# Patient Record
Sex: Male | Born: 1965
Health system: Southern US, Community
[De-identification: ages and names within clinical notes are randomized; demographics above are authoritative.]

## PROBLEM LIST (undated history)

## (undated) DIAGNOSIS — M109 Gout, unspecified: Secondary | ICD-10-CM

## (undated) DIAGNOSIS — I10 Essential (primary) hypertension: Secondary | ICD-10-CM

## (undated) DIAGNOSIS — M25511 Pain in right shoulder: Secondary | ICD-10-CM

## (undated) DIAGNOSIS — E786 Lipoprotein deficiency: Secondary | ICD-10-CM

## (undated) DIAGNOSIS — S39012A Strain of muscle, fascia and tendon of lower back, initial encounter: Secondary | ICD-10-CM

## (undated) DIAGNOSIS — J302 Other seasonal allergic rhinitis: Secondary | ICD-10-CM

## (undated) DIAGNOSIS — E669 Obesity, unspecified: Secondary | ICD-10-CM

## (undated) DIAGNOSIS — E7800 Pure hypercholesterolemia, unspecified: Secondary | ICD-10-CM

## (undated) DIAGNOSIS — N200 Calculus of kidney: Secondary | ICD-10-CM

## (undated) HISTORY — PX: WISDOM TOOTH EXTRACTION: SHX21

## (undated) HISTORY — DX: Strain of muscle, fascia and tendon of lower back, initial encounter: S39.012A

## (undated) HISTORY — DX: Obesity, unspecified: E66.9

## (undated) HISTORY — DX: Pain in right shoulder: M25.511

## (undated) HISTORY — DX: Other seasonal allergic rhinitis: J30.2

## (undated) HISTORY — DX: Gout, unspecified: M10.9

## (undated) HISTORY — PX: CHOLECYSTECTOMY: SHX55

## (undated) HISTORY — DX: Pure hypercholesterolemia, unspecified: E78.00

## (undated) HISTORY — DX: Lipoprotein deficiency: E78.6

## (undated) HISTORY — DX: Calculus of kidney: N20.0

## (undated) HISTORY — DX: Essential (primary) hypertension: I10

---

## 2006-12-13 ENCOUNTER — Ambulatory Visit: Payer: Self-pay | Admitting: General Practice

## 2006-12-17 ENCOUNTER — Other Ambulatory Visit: Payer: Self-pay

## 2006-12-18 ENCOUNTER — Ambulatory Visit: Payer: Self-pay | Admitting: Surgery

## 2014-12-24 ENCOUNTER — Emergency Department: Payer: Self-pay | Admitting: Emergency Medicine

## 2014-12-24 LAB — BASIC METABOLIC PANEL
ANION GAP: 6 — AB (ref 7–16)
BUN: 14 mg/dL (ref 7–18)
CREATININE: 1.15 mg/dL (ref 0.60–1.30)
Calcium, Total: 8.9 mg/dL (ref 8.5–10.1)
Chloride: 105 mmol/L (ref 98–107)
Co2: 30 mmol/L (ref 21–32)
GLUCOSE: 94 mg/dL (ref 65–99)
Osmolality: 281 (ref 275–301)
Potassium: 3.8 mmol/L (ref 3.5–5.1)
SODIUM: 141 mmol/L (ref 136–145)

## 2014-12-24 LAB — CBC
HCT: 46.2 % (ref 40.0–52.0)
HGB: 15.6 g/dL (ref 13.0–18.0)
MCH: 30.4 pg (ref 26.0–34.0)
MCHC: 33.8 g/dL (ref 32.0–36.0)
MCV: 90 fL (ref 80–100)
PLATELETS: 172 10*3/uL (ref 150–440)
RBC: 5.14 10*6/uL (ref 4.40–5.90)
RDW: 14 % (ref 11.5–14.5)
WBC: 7 10*3/uL (ref 3.8–10.6)

## 2014-12-24 LAB — TROPONIN I

## 2016-03-08 ENCOUNTER — Ambulatory Visit
Admission: RE | Admit: 2016-03-08 | Discharge: 2016-03-08 | Disposition: A | Discharge status: Home / Self Care | Payer: Worker's Compensation | Source: Ambulatory Visit | Attending: Family Medicine | Admitting: Family Medicine

## 2016-03-08 ENCOUNTER — Other Ambulatory Visit: Payer: Self-pay | Admitting: Family Medicine

## 2016-03-08 DIAGNOSIS — M25562 Pain in left knee: Secondary | ICD-10-CM | POA: Diagnosis not present

## 2016-03-08 DIAGNOSIS — M1712 Unilateral primary osteoarthritis, left knee: Secondary | ICD-10-CM | POA: Diagnosis not present

## 2016-03-08 DIAGNOSIS — R52 Pain, unspecified: Secondary | ICD-10-CM

## 2016-03-08 DIAGNOSIS — M25462 Effusion, left knee: Secondary | ICD-10-CM | POA: Insufficient documentation

## 2016-03-24 ENCOUNTER — Other Ambulatory Visit: Payer: Self-pay | Admitting: Family Medicine

## 2016-03-24 DIAGNOSIS — M25562 Pain in left knee: Secondary | ICD-10-CM

## 2016-04-13 ENCOUNTER — Ambulatory Visit
Admission: RE | Admit: 2016-04-13 | Discharge: 2016-04-13 | Disposition: A | Discharge status: Home / Self Care | Payer: Worker's Compensation | Source: Ambulatory Visit | Attending: Family Medicine | Admitting: Family Medicine

## 2016-04-13 DIAGNOSIS — M1712 Unilateral primary osteoarthritis, left knee: Secondary | ICD-10-CM | POA: Diagnosis not present

## 2016-04-13 DIAGNOSIS — M25562 Pain in left knee: Secondary | ICD-10-CM

## 2016-04-13 DIAGNOSIS — M7042 Prepatellar bursitis, left knee: Secondary | ICD-10-CM | POA: Insufficient documentation

## 2016-04-13 DIAGNOSIS — M25462 Effusion, left knee: Secondary | ICD-10-CM | POA: Insufficient documentation

## 2016-04-13 DIAGNOSIS — M660 Rupture of popliteal cyst: Secondary | ICD-10-CM | POA: Diagnosis not present

## 2016-08-17 IMAGING — MR MR KNEE*L* W/O CM
6 series · 40 of 40 positions shown · non-contrast
Comparison: 03/08/2016

CLINICAL DATA: Stepping injury in early February 2016, continued pain
in left knee especially laterally, instability.

EXAM:
MRI OF THE LEFT KNEE WITHOUT CONTRAST
TECHNIQUE: Multiplanar, multisequence MR imaging of the knee was performed. No
intravenous contrast was administered.

[Series 3: PD fat-sat · axial · 3.0mm · 0.62mm/px · z∈[-48,+80]mm · 9 of 40 slices shown (1 of 4)]
[im 1/40]
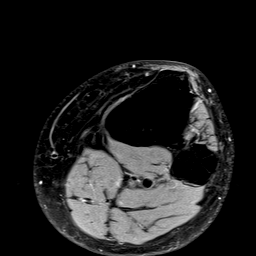
[im 5/40]
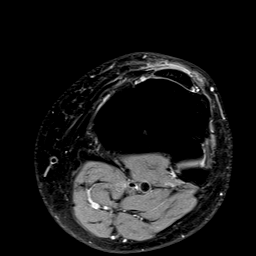
[im 10/40]
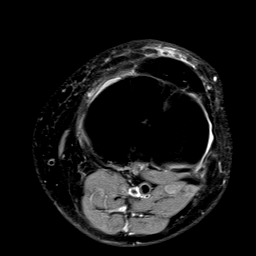
[im 15/40]
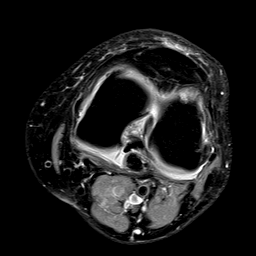
[im 20/40]
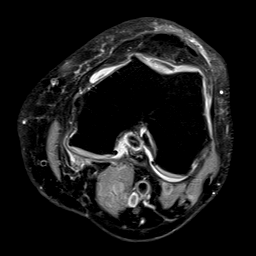
[im 25/40]
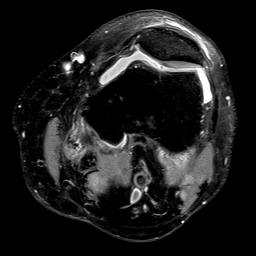
[im 30/40]
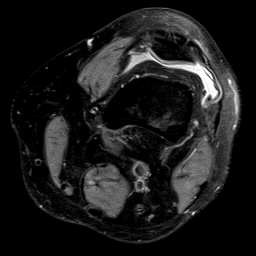
[im 35/40]
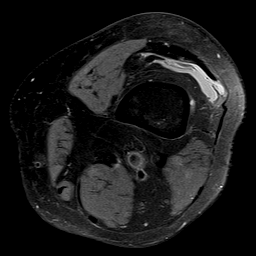
[im 40/40]
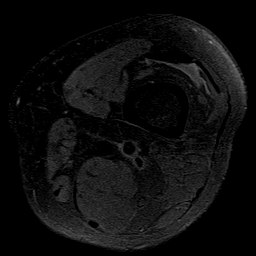

[Series 4: T1 · coronal · 3.0mm · 0.50mm/px · 7 of 35 slices shown]
[im 1/35]
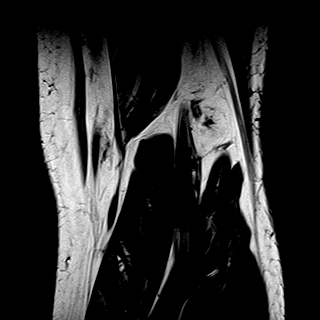
[im 6/35]
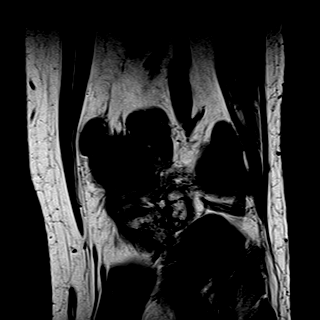
[im 12/35]
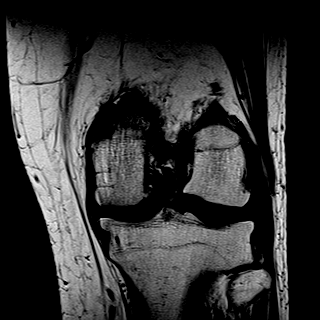
[im 18/35]
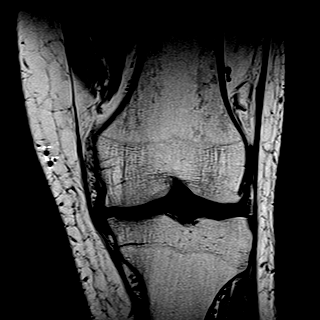
[im 23/35]
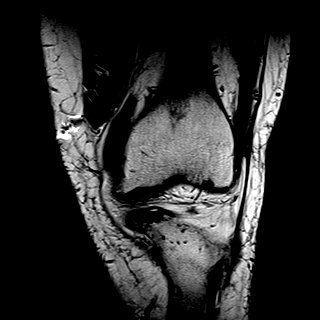
[im 29/35]
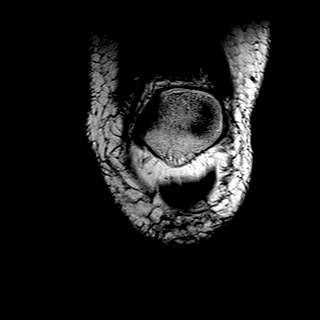
[im 35/35]
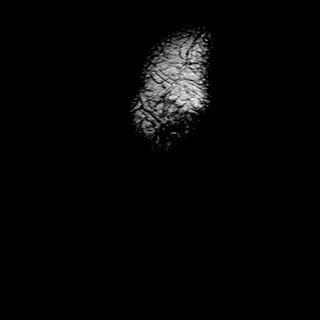

[Series 5: T2 fat-sat · coronal · 3.0mm · 0.31mm/px · 7 of 35 slices shown]
[im 1/35]
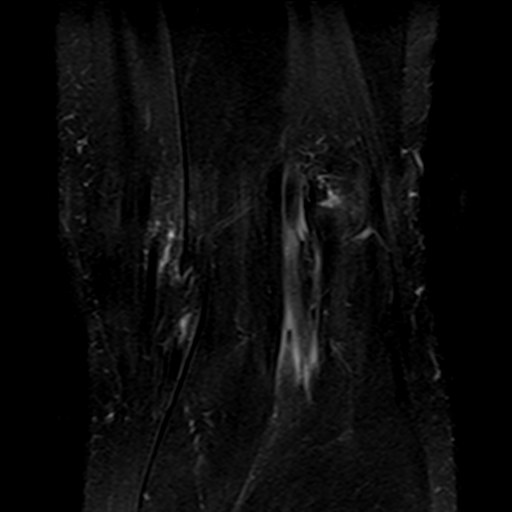
[im 6/35]
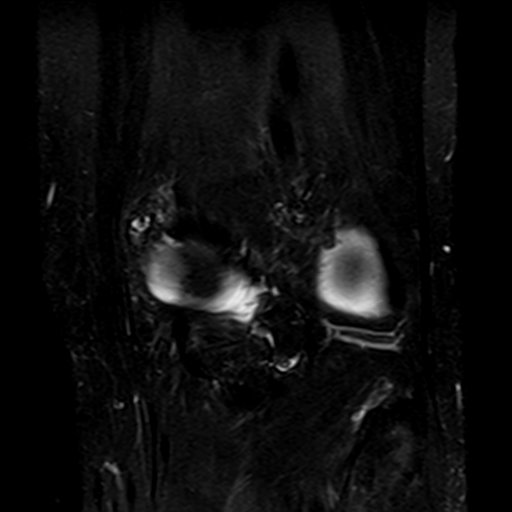
[im 12/35]
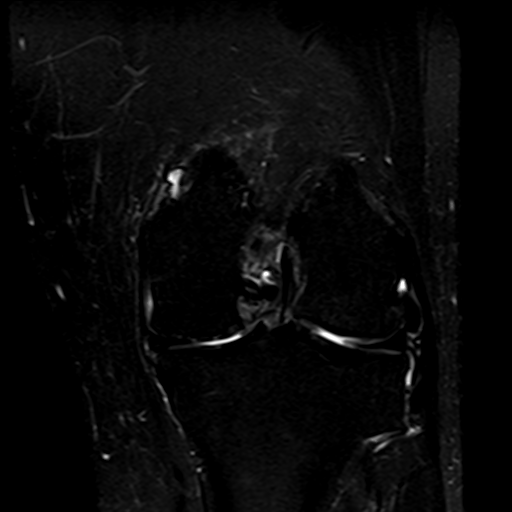
[im 18/35]
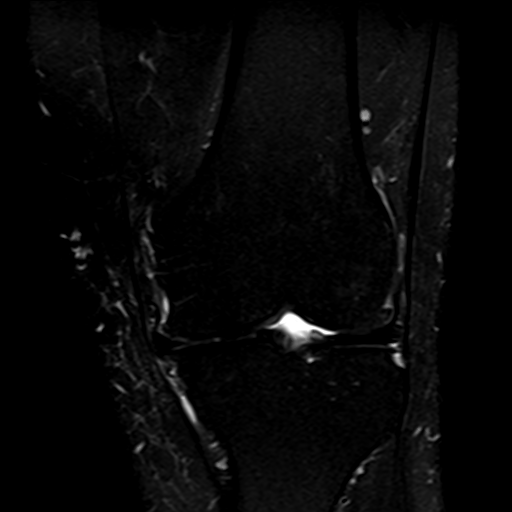
[im 23/35]
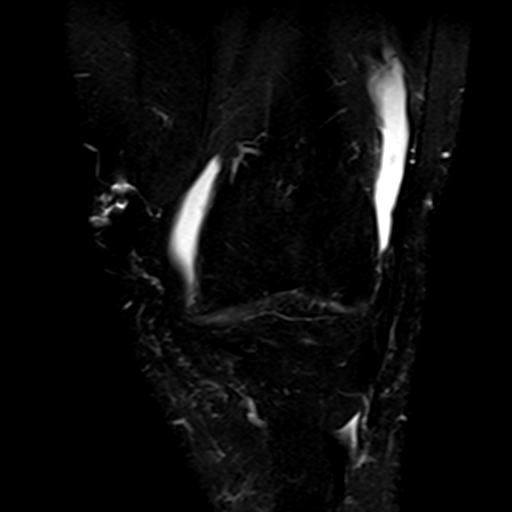
[im 29/35]
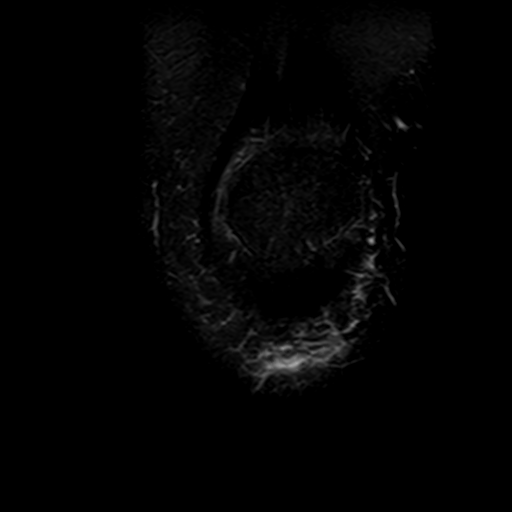
[im 35/35]
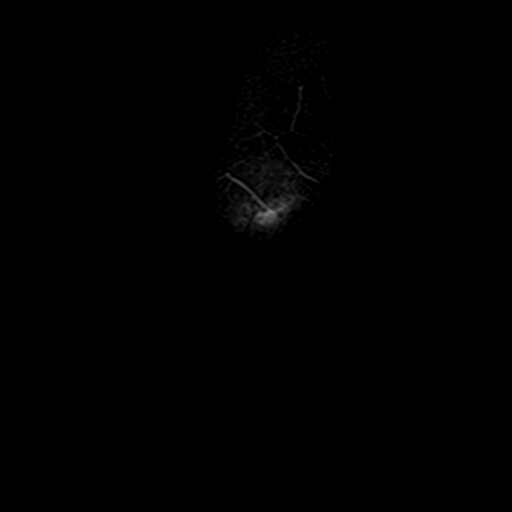

[Series 6: PD fat-sat · coronal · 3.0mm · 0.62mm/px · 7 of 35 slices shown (2 of 4)]
[im 1/35]
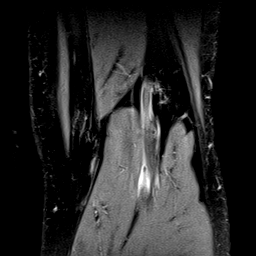
[im 6/35]
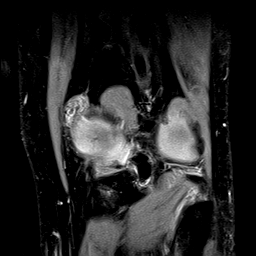
[im 12/35]
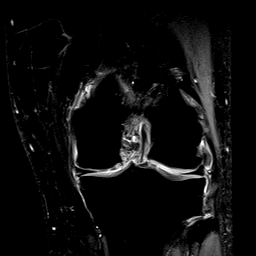
[im 18/35]
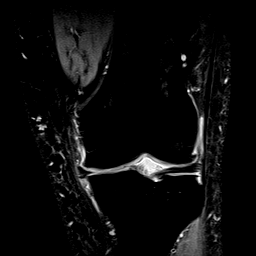
[im 23/35]
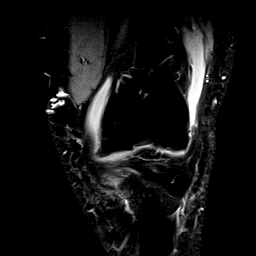
[im 29/35]
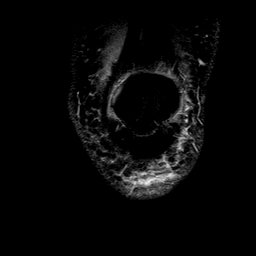
[im 35/35]
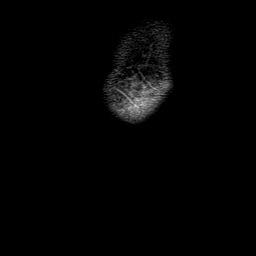

[Series 7: PD fat-sat · sagittal · 3.0mm · 0.62mm/px · 7 of 35 slices shown (3 of 4)]
[im 1/35]
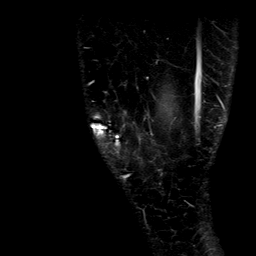
[im 6/35]
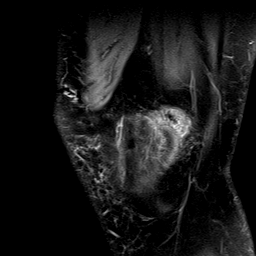
[im 12/35]
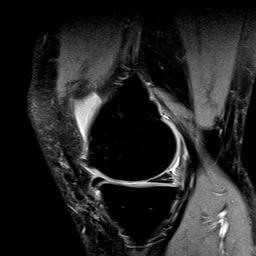
[im 18/35]
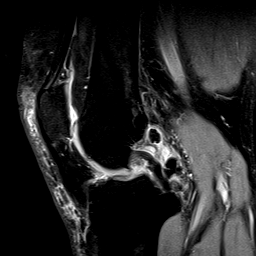
[im 23/35]
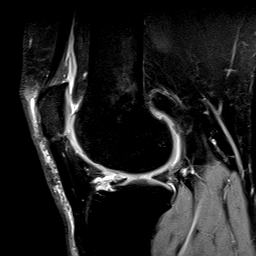
[im 29/35]
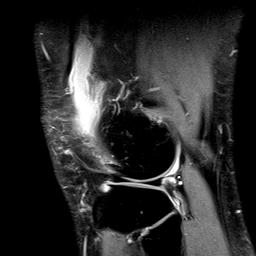
[im 35/35]
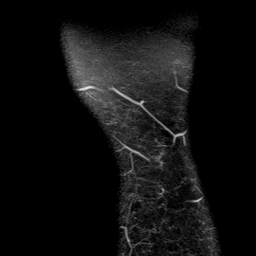

[Series 8: PD fat-sat · oblique · 2.0mm · 0.62mm/px · 3 of 13 slices shown (4 of 4)]
[im 1/13]
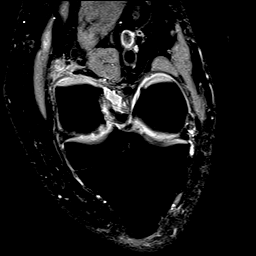
[im 7/13]
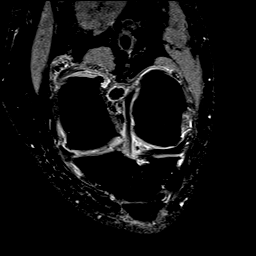
[im 13/13]
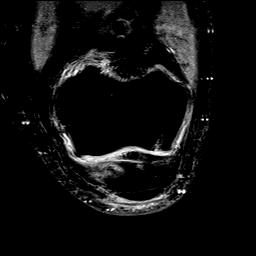

[40 of 40 positions shown; findings below may reference images not displayed]

FINDINGS: MENISCI

Medial meniscus: Grade 2 signal in the posterior horn and midbody
medial meniscus without surface extension.

Lateral meniscus:  Unremarkable

LIGAMENTS

Cruciates:  Unremarkable

Collaterals: Mild edema tracks adjacent to the MCL. This can be
incidental but in the appropriate clinical circumstance could
represent grade 1 sprain.

CARTILAGE

Patellofemoral: Prominent thinning of articular cartilage inferiorly
along the medial patellar facet. Moderate to prominent chondral
thinning in the femoral trochlear groove with some focal
full-thickness cartilage loss and focal degenerative subcortical
bandlike edema inferiorly in the lateral femoral trochlear groove.
Articular spurring inferiorly in the medial femoral trochlear
groove. Patellar marginal spurring.

Medial: Severe degenerative chondral thinning. Heterogeneity of the
remaining medial femoral condylar cartilage. Marginal spurring.

Lateral: Focal chondral full-thickness defect about 1.5 cm in
diameter centrally along the lateral femoral condyle. Marginal
spurring.

Joint:  Small to moderate knee effusion.

There numerous free osteochondral fragments loose within the knee
joint. Posteriorly, the largest of these measures 2.3 by 0.9 by
cm as on image [DATE]. Adjacent fragment measures 5 mm. Posterior to
the lateral meniscus there is a 4 mm potential fragment and along
the upper margin of the Baker' s cyst there is a 9 mm irregular
fragment. Superiorly and posteriorly in the intercondylar notch
there is a 1 cm fragment as shown up on image [DATE]. 3 mm loose
fragment adjacent to the root of the anterior horn lateral meniscus
on image [DATE].

Popliteal Fossa: Small probably ruptured Baker's cyst containing
free osteochondral fragment superiorly.

Extensor Mechanism: Prepatellar subcutaneous edema and potential
mild prepatellar bursitis.

Bones: No significant extra-articular osseous abnormalities
identified.
IMPRESSION: 1. Numerous free osteochondral fragments are loose within the joint,
and also in the small ruptured Baker's cyst.
2. Severe osteoarthritis with some focal full-thickness chondral
loss in the lateral compartment and severe chondral thinning in the
medial compartment.
3. Small to moderate knee effusion.
4. Mild edema tracks adjacent to the MCL. This can be incidental but
in the appropriate clinical circumstance could represent grade 1
sprain.
5. Prepatellar subcutaneous edema and potential mild prepatellar
bursitis.

## 2019-06-07 DIAGNOSIS — Z20828 Contact with and (suspected) exposure to other viral communicable diseases: Secondary | ICD-10-CM | POA: Diagnosis not present

## 2019-08-21 ENCOUNTER — Ambulatory Visit: Payer: Self-pay

## 2019-08-21 DIAGNOSIS — Z23 Encounter for immunization: Secondary | ICD-10-CM

## 2019-10-28 ENCOUNTER — Encounter: Payer: Self-pay | Admitting: Occupational Medicine

## 2019-10-28 ENCOUNTER — Other Ambulatory Visit: Payer: Self-pay

## 2019-10-28 ENCOUNTER — Ambulatory Visit: Payer: Self-pay | Admitting: Occupational Medicine

## 2019-10-28 DIAGNOSIS — M545 Low back pain, unspecified: Secondary | ICD-10-CM

## 2019-10-28 DIAGNOSIS — M549 Dorsalgia, unspecified: Secondary | ICD-10-CM | POA: Insufficient documentation

## 2019-10-28 MED ORDER — CYCLOBENZAPRINE HCL 10 MG PO TABS: 10.0000 mg | ORAL_TABLET | Freq: Every day | ORAL | 0 refills | Status: DC

## 2019-10-28 NOTE — Progress Notes (Signed)
   Subjective:    Patient ID: Jimmy Mccall, male    DOB: 17-Oct-1966, 53 y.o.   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  He presents with a 2-day history of left SI joint pain.  There is no workplace injury or accident.  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  Sunday morning he woke up with left SI joint pain.  Denies any leg numbness weakness or pain.  No bowel or bladder changes.  He had a similar episode of low back pain back in 2012.  This was relieved with time and cyclobenzaprine.  He tells me this episode heat is quite helpful.    Review of Systems Negative for leg numbness weakness pain.  No bowel bladder changes.  No history of trauma.    Objective:   Physical Exam Vitals:   10/28/19 0814  BP: 140/90  Pulse: 72  Resp: 16  Temp: 99.1 F (37.3 C)  SpO2: 97%   Appears in minor distress.  Transitions are slow and careful.  Lumbar range of motion is limited.  He can touch his knees.  Tenderness in the left SI joint region.  Normal strength sensory and reflexes in both lower extremities.  Positive Faber on the left.  No flank pain.      Assessment & Plan:  I think he is having some SI joint inflammation and recommend continued use of aleve over-the-counter up to 2 tablets 2 times a day.  I will authorize prescription for Flexeril 10 mg1/2 to 1 tab  at bedtime #15 no refills.  Prescription sent electronically.  I also showed him some online video exercises for SI joint dysfunction and instructed him to do these daily. 2 boxes of thermacare given from clinic.   He will be on alternate duty until Monday, December 14.  He may resume full duty Monday, December 14.  No plan follow-up in this clinic, only if problems.

## 2019-10-28 NOTE — Progress Notes (Signed)
Woke up Sunday morning & back was tight.  Feels good when standing up.  When sits down it tightens back up.  States when took hot showers yesterday & this morning felt some relief.  Taking Advil 200 mg (2 tabs) every 4-5 hours with minimal relief.Had an old bottle of Percocet prescribed by Dr. Corinda Gubler & said he's taken a couple of them & they are not helping.  Can't bend over to tie shoes.  Hurt like this 8 years ago when Dr. Burt Ek tx'd him for Lumbar strain & tx'd with aleve bid & cyclobenzeprene 5 mg tid back on 07/28/2011.  AMD

## 2019-12-02 ENCOUNTER — Other Ambulatory Visit: Payer: Self-pay

## 2019-12-02 ENCOUNTER — Ambulatory Visit: Payer: Self-pay | Admitting: Occupational Medicine

## 2019-12-02 ENCOUNTER — Encounter: Payer: Self-pay | Admitting: Occupational Medicine

## 2019-12-02 VITALS — BP 150/86 | HR 96 | Temp 98.7°F | Resp 20 | Ht 70.0 in | Wt 252.0 lb

## 2019-12-02 DIAGNOSIS — Z0000 Encounter for general adult medical examination without abnormal findings: Secondary | ICD-10-CM

## 2019-12-02 LAB — POCT URINALYSIS DIPSTICK
Bilirubin, UA: NEGATIVE
Blood, UA: NEGATIVE
Glucose, UA: NEGATIVE
Ketones, UA: NEGATIVE
Leukocytes, UA: NEGATIVE
Nitrite, UA: NEGATIVE
Protein, UA: NEGATIVE
Spec Grav, UA: 1.025 (ref 1.010–1.025)
Urobilinogen, UA: 0.2 E.U./dL
pH, UA: 6 (ref 5.0–8.0)

## 2019-12-02 NOTE — Progress Notes (Signed)
DOT exam performed at onsite employment clinic.  Given 1 year medical card.  Paper records stored onsite.

## 2019-12-15 ENCOUNTER — Ambulatory Visit: Payer: Self-pay

## 2019-12-15 ENCOUNTER — Other Ambulatory Visit: Payer: Self-pay

## 2019-12-15 DIAGNOSIS — Z0000 Encounter for general adult medical examination without abnormal findings: Secondary | ICD-10-CM

## 2019-12-15 LAB — POCT URINALYSIS DIPSTICK
Bilirubin, UA: NEGATIVE
Blood, UA: NEGATIVE
Glucose, UA: NEGATIVE
Ketones, UA: NEGATIVE
Leukocytes, UA: NEGATIVE
Nitrite, UA: NEGATIVE
Protein, UA: NEGATIVE
Spec Grav, UA: 1.025 (ref 1.010–1.025)
Urobilinogen, UA: 0.2 E.U./dL
pH, UA: 6 (ref 5.0–8.0)

## 2019-12-15 NOTE — Progress Notes (Signed)
Scheduled to complete physical 12/22/2019 with Darlin Priestly, PA-C (Interim Provider).  AMD

## 2019-12-16 LAB — CMP12+LP+TP+TSH+6AC+PSA+CBC…
ALT: 34 IU/L (ref 0–44)
AST: 20 IU/L (ref 0–40)
Albumin/Globulin Ratio: 1.8 (ref 1.2–2.2)
Alkaline Phosphatase: 124 IU/L — ABNORMAL HIGH (ref 39–117)
BUN/Creatinine Ratio: 13 (ref 9–20)
BUN: 15 mg/dL (ref 6–24)
Basophils Absolute: 0 10*3/uL (ref 0.0–0.2)
Chloride: 101 mmol/L (ref 96–106)
Chol/HDL Ratio: 4.4 ratio (ref 0.0–5.0)
Eos: 2 %
Estimated CHD Risk: 0.9 times avg. (ref 0.0–1.0)
GFR calc non Af Amer: 71 mL/min/{1.73_m2} (ref >59)
GGT: 37 IU/L (ref 0–65)
Globulin, Total: 2.4 g/dL (ref 1.5–4.5)
Hematocrit: 50.1 % (ref 37.5–51.0)
Hemoglobin: 17.2 g/dL (ref 13.0–17.7)
LDH: 197 IU/L (ref 121–224)
LDL Chol Calc (NIH): 117 mg/dL — ABNORMAL HIGH (ref 0–99)
MCHC: 34.3 g/dL (ref 31.5–35.7)
Monocytes Absolute: 0.6 10*3/uL (ref 0.1–0.9)
Monocytes: 8 %
Neutrophils Absolute: 5.7 10*3/uL (ref 1.4–7.0)
Platelets: 198 10*3/uL (ref 150–450)
Potassium: 3.9 mmol/L (ref 3.5–5.2)
RDW: 13.4 % (ref 11.6–15.4)
T3 Uptake Ratio: 22 % — ABNORMAL LOW (ref 24–39)
Triglycerides: 111 mg/dL (ref 0–149)
Uric Acid: 4.5 mg/dL (ref 3.8–8.4)

## 2019-12-16 LAB — CMP12+LP+TP+TSH+6AC+PSA+CBC?
Albumin: 4.4 g/dL (ref 3.8–4.9)
Basos: 1 %
Bilirubin Total: 1.5 mg/dL — ABNORMAL HIGH (ref 0.0–1.2)
Calcium: 9.1 mg/dL (ref 8.7–10.2)
Cholesterol, Total: 177 mg/dL (ref 100–199)
Creatinine, Ser: 1.16 mg/dL (ref 0.76–1.27)
EOS (ABSOLUTE): 0.1 10*3/uL (ref 0.0–0.4)
Free Thyroxine Index: 1.5 (ref 1.2–4.9)
GFR calc Af Amer: 83 mL/min/{1.73_m2} (ref >59)
Glucose: 104 mg/dL — ABNORMAL HIGH (ref 65–99)
HDL: 40 mg/dL (ref >39)
Immature Grans (Abs): 0.1 10*3/uL (ref 0.0–0.1)
Immature Granulocytes: 1 %
Iron: 135 ug/dL (ref 38–169)
Lymphocytes Absolute: 1.5 10*3/uL (ref 0.7–3.1)
Lymphs: 19 %
MCH: 31.1 pg (ref 26.6–33.0)
MCV: 91 fL (ref 79–97)
Neutrophils: 69 %
Phosphorus: 3.2 mg/dL (ref 2.8–4.1)
Prostate Specific Ag, Serum: 0.9 ng/mL (ref 0.0–4.0)
RBC: 5.53 x10E6/uL (ref 4.14–5.80)
Sodium: 141 mmol/L (ref 134–144)
T4, Total: 6.7 ug/dL (ref 4.5–12.0)
TSH: 2.98 u[IU]/mL (ref 0.450–4.500)
Total Protein: 6.8 g/dL (ref 6.0–8.5)
VLDL Cholesterol Cal: 20 mg/dL (ref 5–40)
WBC: 8.1 10*3/uL (ref 3.4–10.8)

## 2019-12-22 ENCOUNTER — Other Ambulatory Visit: Payer: Self-pay

## 2019-12-22 ENCOUNTER — Ambulatory Visit: Payer: Self-pay | Admitting: Physician Assistant

## 2019-12-22 VITALS — BP 150/98 | HR 77 | Temp 99.0°F | Ht 70.0 in | Wt 256.8 lb

## 2019-12-22 DIAGNOSIS — Z0000 Encounter for general adult medical examination without abnormal findings: Secondary | ICD-10-CM

## 2019-12-22 MED ORDER — ALLOPURINOL 300 MG PO TABS: 300.0000 mg | ORAL_TABLET | Freq: Every day | ORAL | 3 refills | Status: DC

## 2019-12-22 MED ORDER — HYDROCHLOROTHIAZIDE 25 MG PO TABS: 25.0000 mg | ORAL_TABLET | Freq: Every day | ORAL | 3 refills | Status: DC

## 2019-12-22 NOTE — Progress Notes (Signed)
   Subjective: Annual physical    Patient ID: Jimmy Mccall, male    DOB: 1966/08/18, 54 y.o.   MRN: [id number]  HPI Patient presents with annual physical.  First concern of bilateral knee/ankle pain.   Review of Systems Hypertension and gout    Objective:   Physical Exam Patient appears no acute distress.  Obesity.  HEENT is unremarkable.  Neck without adenopathy or bruits.  Lungs clear to auscultation.  Heart regular rate and rhythm.  Abdomen mildly obese.  Normoactive bowel sounds.  Soft nontender palpation.  No obvious deformity to cervical lumbar spine.  Patient has full equal range of motion of the cervical lumbar spine.  No obvious deformity of the upper or lower extremities.  Patient had full neck range of motion of the upper and lower extremities.  Cranial nerves II through XII are grossly intact.       Assessment & Plan: Well exam.  Patient advised continue previous medications and follow-up as needed.  Prescription for allopurinol 100 hydrochlorothiazide was renewed.

## 2020-02-11 ENCOUNTER — Other Ambulatory Visit: Payer: Self-pay

## 2020-02-11 ENCOUNTER — Other Ambulatory Visit: Payer: Self-pay | Admitting: Physician Assistant

## 2020-02-11 DIAGNOSIS — M109 Gout, unspecified: Secondary | ICD-10-CM

## 2020-02-11 MED ORDER — ALLOPURINOL 100 MG PO TABS: 100.0000 mg | ORAL_TABLET | Freq: Every day | ORAL | 3 refills | Status: DC

## 2020-05-10 ENCOUNTER — Other Ambulatory Visit: Payer: Self-pay

## 2020-05-10 NOTE — Telephone Encounter (Signed)
Received a Rx request from [facility] for HCTZ 25 mg tablets.  Epic has that it was refilled 12/23/19 by Randel Pigg, PA-C for 90 tablets with 3 refills.  Sales promotion account executive & the pharmacist I spoke will said they do have the above Rx in their system.  Said to disregard the refill request dated 05/08/20.  They will get Jimmy Mccall's Rx ready for him.  AMD

## 2020-10-06 ENCOUNTER — Ambulatory Visit: Payer: Self-pay

## 2020-10-06 DIAGNOSIS — Z23 Encounter for immunization: Secondary | ICD-10-CM

## 2020-11-24 ENCOUNTER — Other Ambulatory Visit: Payer: Self-pay

## 2020-11-24 ENCOUNTER — Ambulatory Visit: Payer: Self-pay

## 2020-11-24 DIAGNOSIS — Z0000 Encounter for general adult medical examination without abnormal findings: Secondary | ICD-10-CM

## 2020-11-24 LAB — POCT URINALYSIS DIPSTICK
Bilirubin, UA: NEGATIVE
Blood, UA: NEGATIVE
Glucose, UA: NEGATIVE
Ketones, UA: NEGATIVE
Leukocytes, UA: NEGATIVE
Nitrite, UA: NEGATIVE
Protein, UA: NEGATIVE
Spec Grav, UA: 1.025 (ref 1.010–1.025)
Urobilinogen, UA: 0.2 E.U./dL
pH, UA: 6 (ref 5.0–8.0)

## 2020-11-24 NOTE — Progress Notes (Unsigned)
Scheduled to complete physical 12/02/19 with Dr. Pricilla Handler (Physician's name not yet associated to clinic in Epic).  AMD

## 2020-11-25 LAB — CMP12+LP+TP+TSH+6AC+PSA+CBC…
ALT: 40 IU/L (ref 0–44)
AST: 25 IU/L (ref 0–40)
Albumin/Globulin Ratio: 2 (ref 1.2–2.2)
Albumin: 4.7 g/dL (ref 3.8–4.9)
Alkaline Phosphatase: 135 IU/L — ABNORMAL HIGH (ref 44–121)
BUN/Creatinine Ratio: 12 (ref 9–20)
BUN: 14 mg/dL (ref 6–24)
Basophils Absolute: 0.1 10*3/uL (ref 0.0–0.2)
Basos: 1 %
Bilirubin Total: 1.4 mg/dL — ABNORMAL HIGH (ref 0.0–1.2)
Calcium: 9.6 mg/dL (ref 8.7–10.2)
Chloride: 98 mmol/L (ref 96–106)
Chol/HDL Ratio: 5.1 ratio — ABNORMAL HIGH (ref 0.0–5.0)
Cholesterol, Total: 199 mg/dL (ref 100–199)
Creatinine, Ser: 1.13 mg/dL (ref 0.76–1.27)
EOS (ABSOLUTE): 0.3 10*3/uL (ref 0.0–0.4)
Eos: 3 %
Estimated CHD Risk: 1.1 times avg. — ABNORMAL HIGH (ref 0.0–1.0)
Free Thyroxine Index: 1.8 (ref 1.2–4.9)
GFR calc Af Amer: 85 mL/min/{1.73_m2} (ref >59)
GFR calc non Af Amer: 73 mL/min/{1.73_m2} (ref >59)
GGT: 49 IU/L (ref 0–65)
Globulin, Total: 2.4 g/dL (ref 1.5–4.5)
Glucose: 93 mg/dL (ref 65–99)
HDL: 39 mg/dL — ABNORMAL LOW (ref >39)
Hematocrit: 50.4 % (ref 37.5–51.0)
Hemoglobin: 17.1 g/dL (ref 13.0–17.7)
Immature Grans (Abs): 0.1 10*3/uL (ref 0.0–0.1)
Immature Granulocytes: 1 %
Iron: 99 ug/dL (ref 38–169)
LDH: 167 IU/L (ref 121–224)
LDL Chol Calc (NIH): 137 mg/dL — ABNORMAL HIGH (ref 0–99)
Lymphocytes Absolute: 1.7 10*3/uL (ref 0.7–3.1)
Lymphs: 18 %
MCH: 30.7 pg (ref 26.6–33.0)
MCHC: 33.9 g/dL (ref 31.5–35.7)
MCV: 91 fL (ref 79–97)
Monocytes Absolute: 0.7 10*3/uL (ref 0.1–0.9)
Monocytes: 8 %
Neutrophils Absolute: 6.5 10*3/uL (ref 1.4–7.0)
Neutrophils: 69 %
Phosphorus: 3.5 mg/dL (ref 2.8–4.1)
Platelets: 219 10*3/uL (ref 150–450)
Potassium: 3.9 mmol/L (ref 3.5–5.2)
Prostate Specific Ag, Serum: 0.7 ng/mL (ref 0.0–4.0)
RBC: 5.57 x10E6/uL (ref 4.14–5.80)
RDW: 13.2 % (ref 11.6–15.4)
Sodium: 139 mmol/L (ref 134–144)
T3 Uptake Ratio: 24 % (ref 24–39)
T4, Total: 7.7 ug/dL (ref 4.5–12.0)
TSH: 2.75 u[IU]/mL (ref 0.450–4.500)
Total Protein: 7.1 g/dL (ref 6.0–8.5)
Triglycerides: 126 mg/dL (ref 0–149)
Uric Acid: 4.9 mg/dL (ref 3.8–8.4)
VLDL Cholesterol Cal: 23 mg/dL (ref 5–40)
WBC: 9.3 10*3/uL (ref 3.4–10.8)

## 2020-12-01 ENCOUNTER — Encounter: Payer: Self-pay | Admitting: Adult Medicine

## 2020-12-01 ENCOUNTER — Ambulatory Visit: Payer: Self-pay | Admitting: Adult Medicine

## 2020-12-01 ENCOUNTER — Other Ambulatory Visit: Payer: Self-pay

## 2020-12-01 VITALS — BP 150/100 | HR 69 | Temp 98.7°F | Resp 16 | Ht 70.0 in | Wt 240.0 lb

## 2020-12-01 DIAGNOSIS — I10 Essential (primary) hypertension: Secondary | ICD-10-CM | POA: Insufficient documentation

## 2020-12-01 DIAGNOSIS — Z0000 Encounter for general adult medical examination without abnormal findings: Secondary | ICD-10-CM | POA: Insufficient documentation

## 2020-12-01 DIAGNOSIS — M1000 Idiopathic gout, unspecified site: Secondary | ICD-10-CM | POA: Insufficient documentation

## 2020-12-01 MED ORDER — TRIAMTERENE-HCTZ 75-50 MG PO TABS: 1.0000 | ORAL_TABLET | Freq: Every day | ORAL | Status: DC

## 2020-12-01 NOTE — Progress Notes (Signed)
Subjective:    Patient ID: Jimmy Mccall, male    DOB: [birth year], 55 y.o.   MRN: [id number]  HPI  51 his gout well controlled last flare 3y ago presents with no complaints except minor stiffness in L knee from old injury which does not impede function.    On meat restriction diet Elevated BP rx with Hctz  MEDs  300am 100pm Allopurinol    Hctz 25 qd  FH maternal side HTN, brother dec age51 htn, aneuysm SH non smoker  Review of Systems Speaks about hx of difficulty with wt Other wise noncontributory    Objective:   Physical Exam >30 IBW otherwise NAD A&Ox3 normal mood and affect  O2 sat 98 p74 HEENT [place]/AT [place] intact Pulm pickwickian chest, lungsclear to A&P Cardiac PMI 1' lat midline no murmer ectopy unchanged by manuvers Abd protuberant girth 45.5 inches bs present, no organ enlargement appreciated  Extremities  C C E neg Neuro intact sensory/motor, nl 2 pt discrimination, nl proprioception no abn reflexes Glucose 65 - 99 mg/dL 93  104High  94    Uric Acid 3.8 - 8.4 mg/dL 4.9  4.5 CM     Comment:      Therapeutic target for gout patients: <6.0  BUN 6 - 24 mg/dL _0 R    Creatinine, Ser 0.76 - 1.27 mg/dL 1.13  1.16  1.15 R    GFR calc non Af Amer >59 mL/min/1.73 73  71     GFR calc Af Amer >59 mL/min/1.73 85  83     Comment: **In accordance with recommendations from the NKF-ASN Task force,**   [facility] is in the process of updating its eGFR calculation to the   2021 CKD-EPI creatinine equation that estimates kidney function   without a race variable.   BUN/Creatinine Ratio 9 - _1 Sodium 134 - 144 mmol/L 139  141  141 R    Potassium 3.5 - 5.2 mmol/L 3.9  3.9  3.8 R    Chloride 96 - 106 mmol/L 98  101  105 R    Calcium 8.7 - 10.2 mg/dL 9.6  9.1  8.9 R    Phosphorus 2.8 - 4.1 mg/dL 3.5  3.2     Total Protein 6.0 - 8.5 g/dL 7.1  6.8     Albumin 3.8 - 4.9 g/dL 4.7  4.4     Globulin, Total 1.5 - 4.5 g/dL 2.4  2.4      Albumin/Globulin Ratio 1.2 - 2.2 2.0  1.8     Bilirubin Total 0.0 - 1.2 mg/dL 1.4High  1.5High     Alkaline Phosphatase 44 - 121 IU/L 135High  124High R     Comment:       **Please note reference interval change**  LDH 121 - 224 IU/L 167  197     AST 0 - 40 IU/L 25  20     ALT 0 - 44 IU/L 40  34     GGT 0 - 65 IU/L 49  37     Iron 38 - 169 ug/dL 99  135     Cholesterol, Total 100 - 199 mg/dL 199  177     Triglycerides 0 - 149 mg/dL 126  111     HDL >39 mg/dL 39Low  40     VLDL Cholesterol Cal 5 - 40 mg/dL 23  20     LDL Chol Calc (NIH) 0 -  99 mg/dL 137High  117High     Chol/HDL Ratio 0.0 - 5.0 ratio 5.1High  4.4 CM     Comment:                 T. Chol/HDL Ratio                        Men Women                 1/2 Avg.Risk 3.4  3.3                   Avg.Risk 5.0  4.4                 2X Avg.Risk 9.6  7.1                 3X Avg.Risk 23.4  11.0   Estimated CHD Risk 0.0 - 1.0 times avg. 1.1High  0.9 CM     Comment: The CHD Risk is based on the T. Chol/HDL ratio. Other  factors affect CHD Risk such as hypertension, smoking,  diabetes, severe obesity, and family history of  premature CHD.   TSH 0.450 - 4.500 uIU/mL 2.750  2.980     T4, Total 4.5 - 12.0 ug/dL 7.7  6.7     T3 Uptake Ratio 24 - 39 % 24  22Low     Free Thyroxine Index 1.2 - 4.9 1.8  1.5     Prostate Specific Ag, Serum 0.0 - 4.0 ng/mL 0.7  0.9 CM     Comment: Roche ECLIA methodology.          Assessment & Plan:  Annual Well Exam   Potassium 3.9 lab t3 uptake 24  tchol nl ldl 137 hdl 37    Gout well controlled Htn has been on hctz alone for entire therapy. Wt loss discussed. Report eats  q6-7h suggested intercurrent meal plan "brown bag"  Med change to Maxide 75/50 F/u weekly clinic BP to be review and meds updated pending new data

## 2020-12-02 ENCOUNTER — Telehealth: Payer: Self-pay

## 2020-12-02 DIAGNOSIS — I10 Essential (primary) hypertension: Secondary | ICD-10-CM

## 2020-12-02 MED ORDER — PRAZOSIN HCL 1 MG PO CAPS: 1.0000 mg | ORAL_CAPSULE | Freq: Every day | ORAL | 3 refills | Status: DC

## 2020-12-02 MED ORDER — METOLAZONE 2.5 MG PO TABS: 2.5000 mg | ORAL_TABLET | Freq: Every day | ORAL | 3 refills | Status: DC

## 2020-12-02 NOTE — Addendum Note (Signed)
Addended by: Cecil Cobbs on: 12/02/2020 05:29 PM   Modules accepted: Orders

## 2020-12-02 NOTE — Telephone Encounter (Signed)
Dr. Noreene Larsson ordered from 12/01/20 visit.  A D

## 2021-02-10 ENCOUNTER — Other Ambulatory Visit: Payer: Self-pay

## 2021-02-10 DIAGNOSIS — M109 Gout, unspecified: Secondary | ICD-10-CM

## 2021-02-11 ENCOUNTER — Other Ambulatory Visit: Payer: Self-pay

## 2021-02-11 DIAGNOSIS — I10 Essential (primary) hypertension: Secondary | ICD-10-CM

## 2021-02-11 DIAGNOSIS — M109 Gout, unspecified: Secondary | ICD-10-CM

## 2021-02-11 DIAGNOSIS — Z0000 Encounter for general adult medical examination without abnormal findings: Secondary | ICD-10-CM

## 2021-02-11 MED ORDER — PRAZOSIN HCL 1 MG PO CAPS: 1.0000 mg | ORAL_CAPSULE | Freq: Every day | ORAL | 3 refills | Status: DC

## 2021-02-11 MED ORDER — ALLOPURINOL 300 MG PO TABS: 300.0000 mg | ORAL_TABLET | Freq: Every day | ORAL | 3 refills | Status: DC

## 2021-02-11 MED ORDER — ALLOPURINOL 100 MG PO TABS: 100.0000 mg | ORAL_TABLET | Freq: Every day | ORAL | 3 refills | Status: DC

## 2021-02-11 MED ORDER — METOLAZONE 2.5 MG PO TABS: 2.5000 mg | ORAL_TABLET | Freq: Every day | ORAL | 3 refills | Status: DC

## 2021-07-04 ENCOUNTER — Other Ambulatory Visit: Payer: Self-pay

## 2021-07-04 ENCOUNTER — Ambulatory Visit: Payer: Self-pay

## 2021-07-04 VITALS — BP 138/86

## 2021-07-04 DIAGNOSIS — Z0130 Encounter for examination of blood pressure without abnormal findings: Secondary | ICD-10-CM

## 2021-07-04 NOTE — Progress Notes (Signed)
Pt came in for BP check. CL,RMA

## 2021-07-15 ENCOUNTER — Ambulatory Visit: Payer: Self-pay

## 2021-07-15 ENCOUNTER — Other Ambulatory Visit: Payer: Self-pay

## 2021-07-15 VITALS — BP 134/88

## 2021-07-15 DIAGNOSIS — Z0130 Encounter for examination of blood pressure without abnormal findings: Secondary | ICD-10-CM

## 2021-11-08 ENCOUNTER — Other Ambulatory Visit: Payer: Self-pay

## 2021-11-08 DIAGNOSIS — M109 Gout, unspecified: Secondary | ICD-10-CM

## 2021-11-09 ENCOUNTER — Ambulatory Visit: Payer: Self-pay | Admitting: Physician Assistant

## 2021-11-09 ENCOUNTER — Other Ambulatory Visit: Payer: Self-pay

## 2021-11-09 ENCOUNTER — Encounter: Payer: Self-pay | Admitting: Physician Assistant

## 2021-11-09 DIAGNOSIS — M109 Gout, unspecified: Secondary | ICD-10-CM

## 2021-11-09 MED ORDER — ALLOPURINOL 300 MG PO TABS: 300.0000 mg | ORAL_TABLET | Freq: Every day | ORAL | 3 refills | Status: DC

## 2021-11-09 MED ORDER — ALLOPURINOL 100 MG PO TABS: 100.0000 mg | ORAL_TABLET | Freq: Every day | ORAL | 3 refills | Status: DC

## 2021-11-09 NOTE — Progress Notes (Signed)
Pt requesting rx refill of allopurinol./CL,RMA

## 2021-11-09 NOTE — Progress Notes (Signed)
° °  Subjective: Medication refill for gout    Patient ID: Jimmy Mccall, male    DOB: Sep 02, 1966, 55 y.o.   MRN: [id number]  HPI Patient request medication refill of allopurinol at 300 mg every morning and 100 mg nightly.  Patient states pending annual physical exam in January when I have left medication to last until his appointment.  Denies any complaints at this time.   Review of Systems Hypertension    Objective:   Physical Exam No acute distress.  Temperature 99.1, pulse 82, respiration 14, BP is 140/84, patient 96% O2 sat on room air.  Patient weighs 230 pounds BMI is 33.00. HEENT is unremarkable.  Neck is supple follow-up labs.  Or bruits.  Lungs clear to auscultation.  Heart regular rate and rhythm.       Assessment & Plan: Medication refill for gout   Patient patient prescription refilled after discussing sequela of gout.  Advised we will check his uric acid level when he comes in for his annual physical next month.

## 2021-11-24 ENCOUNTER — Ambulatory Visit: Payer: Self-pay

## 2021-11-24 ENCOUNTER — Other Ambulatory Visit: Payer: Self-pay

## 2021-11-24 DIAGNOSIS — Z0000 Encounter for general adult medical examination without abnormal findings: Secondary | ICD-10-CM

## 2021-11-24 LAB — POCT URINALYSIS DIPSTICK
Bilirubin, UA: NEGATIVE
Blood, UA: NEGATIVE
Glucose, UA: NEGATIVE
Ketones, UA: NEGATIVE
Leukocytes, UA: NEGATIVE
Nitrite, UA: NEGATIVE
Protein, UA: POSITIVE — AB
Spec Grav, UA: 1.02 (ref 1.010–1.025)
Urobilinogen, UA: 0.2 E.U./dL
pH, UA: 6 (ref 5.0–8.0)

## 2021-11-24 NOTE — Progress Notes (Signed)
11/28/21 annual physical scheduled.  Reviewed CDC recommendations for importance of HIV/ Hep C screening once in lifetime. Patient has declined HIV / Hep C screenings today and will let us know if they should change their mind in the future.   Pt is aware that its time for his colonoscopy screening and would like more info about it when time for physical apt.

## 2021-11-25 LAB — CMP12+LP+TP+TSH+6AC+PSA+CBC…
ALT: 27 IU/L (ref 0–44)
AST: 22 IU/L (ref 0–40)
Albumin/Globulin Ratio: 2.3 — ABNORMAL HIGH (ref 1.2–2.2)
Albumin: 4.5 g/dL (ref 3.8–4.9)
Alkaline Phosphatase: 115 IU/L (ref 44–121)
BUN/Creatinine Ratio: 15 (ref 9–20)
BUN: 18 mg/dL (ref 6–24)
Basophils Absolute: 0.1 10*3/uL (ref 0.0–0.2)
Basos: 1 %
Bilirubin Total: 1.4 mg/dL — ABNORMAL HIGH (ref 0.0–1.2)
Calcium: 9.9 mg/dL (ref 8.7–10.2)
Chloride: 98 mmol/L (ref 96–106)
Chol/HDL Ratio: 4.7 ratio (ref 0.0–5.0)
Cholesterol, Total: 168 mg/dL (ref 100–199)
Creatinine, Ser: 1.22 mg/dL (ref 0.76–1.27)
EOS (ABSOLUTE): 0.1 10*3/uL (ref 0.0–0.4)
Eos: 2 %
Estimated CHD Risk: 0.9 times avg. (ref 0.0–1.0)
Free Thyroxine Index: 2 (ref 1.2–4.9)
GGT: 33 IU/L (ref 0–65)
Globulin, Total: 2 g/dL (ref 1.5–4.5)
Glucose: 105 mg/dL — ABNORMAL HIGH (ref 70–99)
HDL: 36 mg/dL — ABNORMAL LOW (ref >39)
Hematocrit: 45.5 % (ref 37.5–51.0)
Hemoglobin: 15.7 g/dL (ref 13.0–17.7)
Immature Grans (Abs): 0 10*3/uL (ref 0.0–0.1)
Immature Granulocytes: 0 %
Iron: 98 ug/dL (ref 38–169)
LDH: 146 IU/L (ref 121–224)
LDL Chol Calc (NIH): 110 mg/dL — ABNORMAL HIGH (ref 0–99)
Lymphocytes Absolute: 1.6 10*3/uL (ref 0.7–3.1)
Lymphs: 21 %
MCH: 31.2 pg (ref 26.6–33.0)
MCHC: 34.5 g/dL (ref 31.5–35.7)
MCV: 90 fL (ref 79–97)
Monocytes Absolute: 0.6 10*3/uL (ref 0.1–0.9)
Monocytes: 8 %
Neutrophils Absolute: 5.2 10*3/uL (ref 1.4–7.0)
Neutrophils: 68 %
Phosphorus: 3.7 mg/dL (ref 2.8–4.1)
Platelets: 199 10*3/uL (ref 150–450)
Potassium: 3.5 mmol/L (ref 3.5–5.2)
Prostate Specific Ag, Serum: 0.7 ng/mL (ref 0.0–4.0)
RBC: 5.04 x10E6/uL (ref 4.14–5.80)
RDW: 12.8 % (ref 11.6–15.4)
Sodium: 141 mmol/L (ref 134–144)
T3 Uptake Ratio: 26 % (ref 24–39)
T4, Total: 7.8 ug/dL (ref 4.5–12.0)
TSH: 2.69 u[IU]/mL (ref 0.450–4.500)
Total Protein: 6.5 g/dL (ref 6.0–8.5)
Triglycerides: 122 mg/dL (ref 0–149)
Uric Acid: 6 mg/dL (ref 3.8–8.4)
VLDL Cholesterol Cal: 22 mg/dL (ref 5–40)
WBC: 7.6 10*3/uL (ref 3.4–10.8)
eGFR: 70 mL/min/{1.73_m2} (ref >59)

## 2021-11-28 ENCOUNTER — Ambulatory Visit: Payer: 59 | Admitting: Physician Assistant

## 2021-11-28 DIAGNOSIS — Z0000 Encounter for general adult medical examination without abnormal findings: Secondary | ICD-10-CM

## 2021-12-06 ENCOUNTER — Encounter: Payer: Self-pay | Admitting: Physician Assistant

## 2021-12-06 ENCOUNTER — Ambulatory Visit: Payer: 59 | Admitting: Physician Assistant

## 2021-12-06 ENCOUNTER — Ambulatory Visit: Payer: Self-pay | Admitting: Physician Assistant

## 2021-12-06 ENCOUNTER — Other Ambulatory Visit: Payer: Self-pay

## 2021-12-06 DIAGNOSIS — Z0283 Encounter for blood-alcohol and blood-drug test: Secondary | ICD-10-CM

## 2021-12-06 DIAGNOSIS — Z0000 Encounter for general adult medical examination without abnormal findings: Secondary | ICD-10-CM

## 2021-12-06 NOTE — Progress Notes (Signed)
Pt denies any issues or concerns at this time.

## 2021-12-06 NOTE — Progress Notes (Signed)
[place]   ____________________________________________   None    (approximate)  I have reviewed the triage vital signs and the nursing notes.   HISTORY  Chief Complaint Annual Exam    HPI Jimmy Mccall is a 56 y.o. male patient presents for annual physical exam voices no concerns or complaints.         Past Medical History:  Diagnosis Date   Elevated LDL cholesterol level    Gout    Hypertension    Kidney stone    Low HDL (under 40)    Lumbar strain    Obesity    Seasonal rhinitis    Shoulder pain, right     Patient Active Problem List   Diagnosis Date Noted   Hypertension, uncontrolled 12/01/2020   Routine adult health maintenance 12/01/2020   Idiopathic gout 12/01/2020    Past Surgical History:  Procedure Laterality Date   CHOLECYSTECTOMY     WISDOM TOOTH EXTRACTION      Prior to Admission medications   Medication Sig Start Date End Date Taking? Authorizing Provider  allopurinol (ZYLOPRIM) 100 MG tablet Take 1 tablet (100 mg total) by mouth at bedtime. 11/09/21  Yes Sable Feil, PA-C  allopurinol (ZYLOPRIM) 300 MG tablet Take 1 tablet (300 mg total) by mouth daily. 11/09/21  Yes Sable Feil, PA-C  metolazone (ZAROXOLYN) 2.5 MG tablet Take 1 tablet (2.5 mg total) by mouth daily. 02/11/21  Yes Sable Feil, PA-C  prazosin (MINIPRESS) 1 MG capsule Take 1 capsule (1 mg total) by mouth at bedtime. 02/11/21  Yes Sable Feil, PA-C    Allergies Patient has no known allergies.  History reviewed. No pertinent family history.  Social History Social History   Tobacco Use   Smoking status: Never   Smokeless tobacco: Never    Review of Systems  Constitutional: No fever/chills Eyes: No visual changes. ENT: No sore throat. Cardiovascular: Denies chest pain. Respiratory: Denies shortness of breath. Gastrointestinal: No abdominal pain.  No nausea, no vomiting.  No diarrhea.  No constipation. Genitourinary: Negative for  dysuria. Musculoskeletal: Negative for back pain. Skin: Negative for rash. Neurological: Negative for headaches, focal weakness or numbness. Endocrine: Gout, hyperlipidemia, and hypertension. ____________________________________________   PHYSICAL EXAM:  VITAL SIGNS: See nurses note for vital signs. Constitutional: Alert and oriented. Well appearing and in no acute distress. Eyes: Conjunctivae are normal. PERRL. EOMI. Head: Atraumatic. Nose: No congestion/rhinnorhea. Mouth/Throat: Mucous membranes are moist.  Oropharynx non-erythematous. Neck: No stridor.  No cervical spine tenderness to palpation. Hematological/Lymphatic/Immunilogical: No cervical lymphadenopathy. Cardiovascular: Normal rate, regular rhythm. Grossly normal heart sounds.  Good peripheral circulation. Respiratory: Normal respiratory effort.  No retractions. Lungs CTAB. Gastrointestinal: Soft and nontender. No distention. No abdominal bruits. No CVA tenderness. Genitourinary: Deferred Musculoskeletal: No lower extremity tenderness nor edema.  No joint effusions. Neurologic:  Normal speech and language. No gross focal neurologic deficits are appreciated. No gait instability. Skin:  Skin is warm, dry and intact. No rash noted. Psychiatric: Mood and affect are normal. Speech and behavior are normal.  ____________________________________________   LABS        Component Ref Range & Units 12 d ago (11/24/21) 1 yr ago (11/24/20) 1 yr ago (12/15/19) 2 yr ago (12/02/19)  Color, UA  dark yelllow  Dark Yellow  Dark Yellow  Yellow   Clarity, UA  clear  Clear  Clear  Clear   Glucose, UA Negative Negative  Negative  Negative  Negative   Bilirubin, UA  negative  Negative  Negative  Negative   Ketones, UA  negative  Negative  Negative  Negative   Spec Grav, UA 1.010 - 1.025 1.020  1.025  1.025  1.025   Blood, UA  negative  Negative  Negative  Negative   pH, UA 5.0 - 8.0 6.0  6.0  6.0  6.0   Protein, UA Negative Positive Abnormal    Negative  Negative  Negative   Comment: trace +-  Urobilinogen, UA 0.2 or 1.0 E.U./dL 0.2  0.2  0.2  0.2   Nitrite, UA  negative  Negative  Negative  Negative   Leukocytes, UA Negative Negative  Negative  Negative  Negative   Appearance  dark         Odor                 Specimen Collected: 11/24/21 08:28 Last Resulted: 11/24/21 08:28      Lab Flowsheet    Order Details    View Encounter    Lab and Collection Details    Routing    Result History    View Encounter Conversation        Result Care Coordination   Patient Communication   Add Comments   Seen Back to Top       Other Results from 11/24/2021   Contains abnormal data CMP12+LP+TP+TSH+6AC+PSA+CBC Order: [id number] Status: Final result    Visible to patient: Yes (seen)    Next appt: Today at 02:45 PM in [facility] ([facility] NURSE)    Dx: Routine adult health maintenance    0 Result Notes         Component Ref Range & Units 12 d ago (11/24/21) 1 yr ago (11/24/20) 1 yr ago (12/15/19) 6 yr ago (12/24/14) 6 yr ago (12/24/14)  Glucose 70 - 99 mg/dL 105 High   93 R  104 High  R  94 R    Uric Acid 3.8 - 8.4 mg/dL 6.0  4.9 CM  4.5 CM     Comment:            Therapeutic target for gout patients: <6.0  BUN 6 - 24 mg/dL '18  14  15  14 ' R    Creatinine, Ser 0.76 - 1.27 mg/dL 1.22  1.13  1.16  1.15 R    eGFR >59 mL/min/1.73 70       BUN/Creatinine Ratio 9 - '20 15  12  13     ' Sodium 134 - 144 mmol/L 141  139  141  141 R    Potassium 3.5 - 5.2 mmol/L 3.5  3.9  3.9  3.8 R    Chloride 96 - 106 mmol/L 98  98  101  105 R    Calcium 8.7 - 10.2 mg/dL 9.9  9.6  9.1  8.9 R    Phosphorus 2.8 - 4.1 mg/dL 3.7  3.5  3.2     Total Protein 6.0 - 8.5 g/dL 6.5  7.1  6.8     Albumin 3.8 - 4.9 g/dL 4.5  4.7  4.4     Globulin, Total 1.5 - 4.5 g/dL 2.0  2.4  2.4     Albumin/Globulin Ratio 1.2 - 2.2 2.3 High   2.0  1.8     Bilirubin Total 0.0 - 1.2 mg/dL 1.4 High   1.4 High   1.5 High      Alkaline Phosphatase 44 - 121 IU/L 115  135 High  CM  124  High  R  LDH 121 - 224 IU/L 146  167  197     AST 0 - 40 IU/L '22  25  20     ' ALT 0 - 44 IU/L 27  40  34     GGT 0 - 65 IU/L 33  49  37     Iron 38 - 169 ug/dL 98  99  135     Cholesterol, Total 100 - 199 mg/dL 168  199  177     Triglycerides 0 - 149 mg/dL 122  126  111     HDL >39 mg/dL 36 Low   39 Low   40     VLDL Cholesterol Cal 5 - 40 mg/dL '22  23  20     ' LDL Chol Calc (NIH) 0 - 99 mg/dL 110 High   137 High   117 High      Chol/HDL Ratio 0.0 - 5.0 ratio 4.7  5.1 High  CM  4.4 CM     Comment:                                   T. Chol/HDL Ratio                                              Men  Women                                1/2 Avg.Risk  3.4    3.3                                    Avg.Risk  5.0    4.4                                 2X Avg.Risk  9.6    7.1                                 3X Avg.Risk 23.4   11.0   Estimated CHD Risk 0.0 - 1.0 times avg. 0.9  1.1 High  CM  0.9 CM     Comment: The CHD Risk is based on the T. Chol/HDL ratio. Other  factors affect CHD Risk such as hypertension, smoking,  diabetes, severe obesity, and family history of  premature CHD.   TSH 0.450 - 4.500 uIU/mL 2.690  2.750  2.980     T4, Total 4.5 - 12.0 ug/dL 7.8  7.7  6.7     T3 Uptake Ratio 24 - 39 % '26  24  22 ' Low      Free Thyroxine Index 1.2 - 4.9 2.0  1.8  1.5     Prostate Specific Ag, Serum 0.0 - 4.0 ng/mL 0.7  0.7 CM  0.9 CM     Comment: Roche ECLIA methodology.  According to the American Urological Association, Serum PSA should  decrease and remain at undetectable levels after radical  prostatectomy. The AUA defines biochemical recurrence as an initial  PSA value 0.2 ng/mL or greater followed by a subsequent  confirmatory  PSA value 0.2 ng/mL or greater.  Values obtained with different assay methods or kits cannot be used  interchangeably. Results cannot be interpreted as absolute evidence  of the presence or absence of malignant disease.   WBC 3.4 - 10.8 x10E3/uL 7.6  9.3  8.1    7.0 R   RBC 4.14 - 5.80 x10E6/uL 5.04  5.57  5.53   5.14 R   Hemoglobin 13.0 - 17.7 g/dL 15.7  17.1  17.2     Hematocrit 37.5 - 51.0 % 45.5  50.4  50.1     MCV 79 - 97 fL 90  91  91   90 R   MCH 26.6 - 33.0 pg 31.2  30.7  31.1   30.4 R   MCHC 31.5 - 35.7 g/dL 34.5  33.9  34.3   33.8 R   RDW 11.6 - 15.4 % 12.8  13.2  13.4   14.0 R   Platelets 150 - 450 x10E3/uL 199  219  198   172 R   Neutrophils Not Estab. % 68  69  69     Lymphs Not Estab. % '21  18  19     ' Monocytes Not Estab. % '8  8  8     ' Eos Not Estab. % '2  3  2     ' Basos Not Estab. % '1  1  1     ' Neutrophils Absolute 1.4 - 7.0 x10E3/uL 5.2  6.5  5.7     Lymphocytes Absolute 0.7 - 3.1 x10E3/uL 1.6  1.7  1.5     Monocytes Absolute 0.1 - 0.9 x10E3/uL 0.6  0.7  0.6     EOS (ABSOLUTE) 0.0 - 0.4 x10E3/uL 0.1  0.3  0.1     Basophils Absolute 0.0 - 0.2 x10E3/uL 0.1  0.1  0.0     Immature Granulocytes Not Estab. % 0  1  1                ____________________________________________  EKG Sinus  Rhythm  WITHIN NORMAL LIMITS for rate of 66 bpm   ____________________________________________   No results found.  ____________________________________________     ____________________________________________   INITIAL IMPRESSION / ASSESSMENT AND PLAN   As part of my medical decision making, I reviewed the following data within the [facility]  Well exam.  Discussed lab results with patient advised continue previous medications.           ____________________________________________   FINAL CLINICAL IMPRESSION  Well exam   ED Discharge Orders     None        Note:  This document was prepared using Dragon voice recognition software and may include unintentional dictation errors.

## 2021-12-06 NOTE — Progress Notes (Signed)
Presents to [facility] for [facility] Drug Screen.  [facility] Acct #:  [id number] [facility] Specimen #:  [id number]  AMD

## 2022-02-06 ENCOUNTER — Other Ambulatory Visit: Payer: Self-pay

## 2022-02-06 DIAGNOSIS — I10 Essential (primary) hypertension: Secondary | ICD-10-CM

## 2022-02-06 MED ORDER — METOLAZONE 2.5 MG PO TABS: 2.5000 mg | ORAL_TABLET | Freq: Every day | ORAL | 3 refills | Status: DC

## 2022-02-06 MED ORDER — PRAZOSIN HCL 1 MG PO CAPS: 1.0000 mg | ORAL_CAPSULE | Freq: Every day | ORAL | 3 refills | Status: DC

## 2022-11-01 ENCOUNTER — Other Ambulatory Visit: Payer: Self-pay

## 2022-11-01 DIAGNOSIS — M109 Gout, unspecified: Secondary | ICD-10-CM

## 2022-11-01 MED ORDER — ALLOPURINOL 100 MG PO TABS: 100.0000 mg | ORAL_TABLET | Freq: Every day | ORAL | 3 refills | Status: DC

## 2022-11-01 MED ORDER — ALLOPURINOL 300 MG PO TABS: 300.0000 mg | ORAL_TABLET | Freq: Every day | ORAL | 3 refills | Status: DC

## 2022-11-23 ENCOUNTER — Ambulatory Visit: Payer: Self-pay

## 2022-11-23 DIAGNOSIS — Z0000 Encounter for general adult medical examination without abnormal findings: Secondary | ICD-10-CM

## 2022-11-23 LAB — POCT URINALYSIS DIPSTICK
Bilirubin, UA: NEGATIVE
Blood, UA: NEGATIVE
Glucose, UA: NEGATIVE
Ketones, UA: NEGATIVE
Leukocytes, UA: NEGATIVE
Nitrite, UA: NEGATIVE
Protein, UA: NEGATIVE
Spec Grav, UA: 1.025 (ref 1.010–1.025)
Urobilinogen, UA: 1 E.U./dL
pH, UA: 6 (ref 5.0–8.0)

## 2022-11-23 NOTE — Progress Notes (Signed)
Pt presents today for physical labs, will return to clinic for scheduled physical./CL,RMA

## 2022-11-24 LAB — CMP12+LP+TP+TSH+6AC+PSA+CBC…
ALT: 32 IU/L (ref 0–44)
AST: 23 IU/L (ref 0–40)
Albumin/Globulin Ratio: 2 (ref 1.2–2.2)
Albumin: 4.6 g/dL (ref 3.8–4.9)
Alkaline Phosphatase: 109 IU/L (ref 44–121)
BUN/Creatinine Ratio: 13 (ref 9–20)
BUN: 16 mg/dL (ref 6–24)
Basophils Absolute: 0.1 10*3/uL (ref 0.0–0.2)
Basos: 1 %
Bilirubin Total: 1.7 mg/dL — ABNORMAL HIGH (ref 0.0–1.2)
Calcium: 9.2 mg/dL (ref 8.7–10.2)
Chloride: 97 mmol/L (ref 96–106)
Chol/HDL Ratio: 4.2 ratio (ref 0.0–5.0)
Cholesterol, Total: 163 mg/dL (ref 100–199)
Creatinine, Ser: 1.26 mg/dL (ref 0.76–1.27)
EOS (ABSOLUTE): 0.1 10*3/uL (ref 0.0–0.4)
Eos: 1 %
Estimated CHD Risk: 0.8 times avg. (ref 0.0–1.0)
Free Thyroxine Index: 2.1 (ref 1.2–4.9)
GGT: 33 IU/L (ref 0–65)
Globulin, Total: 2.3 g/dL (ref 1.5–4.5)
Glucose: 110 mg/dL — ABNORMAL HIGH (ref 70–99)
HDL: 39 mg/dL — ABNORMAL LOW (ref >39)
Hematocrit: 47.4 % (ref 37.5–51.0)
Hemoglobin: 15.7 g/dL (ref 13.0–17.7)
Immature Grans (Abs): 0 10*3/uL (ref 0.0–0.1)
Immature Granulocytes: 0 %
Iron: 147 ug/dL (ref 38–169)
LDH: 146 IU/L (ref 121–224)
LDL Chol Calc (NIH): 103 mg/dL — ABNORMAL HIGH (ref 0–99)
Lymphocytes Absolute: 1.6 10*3/uL (ref 0.7–3.1)
Lymphs: 22 %
MCH: 29.6 pg (ref 26.6–33.0)
MCHC: 33.1 g/dL (ref 31.5–35.7)
MCV: 89 fL (ref 79–97)
Monocytes Absolute: 0.6 10*3/uL (ref 0.1–0.9)
Monocytes: 8 %
Neutrophils Absolute: 4.8 10*3/uL (ref 1.4–7.0)
Neutrophils: 68 %
Phosphorus: 3.3 mg/dL (ref 2.8–4.1)
Platelets: 206 10*3/uL (ref 150–450)
Potassium: 2.8 mmol/L — ABNORMAL LOW (ref 3.5–5.2)
Prostate Specific Ag, Serum: 0.8 ng/mL (ref 0.0–4.0)
RBC: 5.3 x10E6/uL (ref 4.14–5.80)
RDW: 13 % (ref 11.6–15.4)
Sodium: 142 mmol/L (ref 134–144)
T3 Uptake Ratio: 26 % (ref 24–39)
T4, Total: 7.9 ug/dL (ref 4.5–12.0)
TSH: 3.04 u[IU]/mL (ref 0.450–4.500)
Total Protein: 6.9 g/dL (ref 6.0–8.5)
Triglycerides: 114 mg/dL (ref 0–149)
Uric Acid: 6.4 mg/dL (ref 3.8–8.4)
VLDL Cholesterol Cal: 21 mg/dL (ref 5–40)
WBC: 7.2 10*3/uL (ref 3.4–10.8)
eGFR: 67 mL/min/{1.73_m2} (ref >59)

## 2022-11-27 NOTE — Addendum Note (Signed)
Addended by: Malachy Moan F on: 11/27/2022 04:17 PM   Modules accepted: Orders

## 2022-11-29 ENCOUNTER — Encounter: Payer: Self-pay | Admitting: Physician Assistant

## 2022-11-29 ENCOUNTER — Ambulatory Visit: Payer: Self-pay | Admitting: Physician Assistant

## 2022-11-29 DIAGNOSIS — L918 Other hypertrophic disorders of the skin: Secondary | ICD-10-CM

## 2022-11-29 DIAGNOSIS — Z0000 Encounter for general adult medical examination without abnormal findings: Secondary | ICD-10-CM

## 2022-11-29 DIAGNOSIS — D229 Melanocytic nevi, unspecified: Secondary | ICD-10-CM

## 2022-11-29 DIAGNOSIS — I10 Essential (primary) hypertension: Secondary | ICD-10-CM

## 2022-11-29 DIAGNOSIS — Z1211 Encounter for screening for malignant neoplasm of colon: Secondary | ICD-10-CM

## 2022-11-29 DIAGNOSIS — M109 Gout, unspecified: Secondary | ICD-10-CM

## 2022-11-29 NOTE — Progress Notes (Signed)
[facility]  ____________________________________________   None    (approximate)  I have reviewed the triage vital signs and the nursing notes.   HISTORY  Chief Complaint Annual Exam   HPI Jimmy Mccall is a 57 y.o. male patient presents for annual physical exam.  Patient request consult for colonoscopy.  Patient also request dermatology consult for multiple nevi and skin tags.        Past Medical History:  Diagnosis Date   Elevated LDL cholesterol level    Gout    Hypertension    Kidney stone    Low HDL (under 40)    Lumbar strain    Obesity    Seasonal rhinitis    Shoulder pain, right     Patient Active Problem List   Diagnosis Date Noted   Hypertension, uncontrolled 12/01/2020   Routine adult health maintenance 12/01/2020   Idiopathic gout 12/01/2020    Past Surgical History:  Procedure Laterality Date   CHOLECYSTECTOMY     WISDOM TOOTH EXTRACTION      Prior to Admission medications   Medication Sig Start Date End Date Taking? Authorizing Provider  allopurinol (ZYLOPRIM) 100 MG tablet Take 1 tablet (100 mg total) by mouth at bedtime. 11/01/22   Sable Feil, PA-C  allopurinol (ZYLOPRIM) 300 MG tablet Take 1 tablet (300 mg total) by mouth daily. 11/01/22   Sable Feil, PA-C  metolazone (ZAROXOLYN) 2.5 MG tablet Take 1 tablet (2.5 mg total) by mouth daily. 02/06/22   Sable Feil, PA-C  prazosin (MINIPRESS) 1 MG capsule Take 1 capsule (1 mg total) by mouth at bedtime. 02/06/22   Sable Feil, PA-C    Allergies Patient has no known allergies.  No family history on file.  Social History Social History   Tobacco Use   Smoking status: Never   Smokeless tobacco: Never    Review of Systems  Constitutional: No fever/chills Eyes: No visual changes. ENT: No sore throat. Cardiovascular: Denies chest pain. Respiratory: Denies shortness of breath. Gastrointestinal: No abdominal pain.  No  nausea, no vomiting.  No diarrhea.  No constipation. Genitourinary: Negative for dysuria. Musculoskeletal: Negative for back pain. Skin: Negative for rash.  Multiple nevi and skin tags Neurological: Negative for headaches, focal weakness or numbness. Endocrine: Gout and hypertension.  ____________________________________________   PHYSICAL EXAM:  VITAL SIGNS: See nurses note for vital signs Constitutional: Alert and oriented. Well appearing and in no acute distress. Eyes: Conjunctivae are normal. PERRL. EOMI. Head: Atraumatic. Nose: No congestion/rhinnorhea. Mouth/Throat: Mucous membranes are moist.  Oropharynx non-erythematous. Neck: No stridor.  No cervical spine tenderness to palpation. Hematological/Lymphatic/Immunilogical: No cervical lymphadenopathy. Cardiovascular: Normal rate, regular rhythm. Grossly normal heart sounds.  Good peripheral circulation. Respiratory: Normal respiratory effort.  No retractions. Lungs CTAB. Gastrointestinal: Soft and nontender. No distention. No abdominal bruits. No CVA tenderness. Genitourinary: Deferred Musculoskeletal: No lower extremity tenderness nor edema.  No joint effusions. Neurologic:  Normal speech and language. No gross focal neurologic deficits are appreciated. No gait instability. Skin:  Skin is warm, dry and intact. No rash noted. Psychiatric: Mood and affect are normal. Speech and behavior are normal.  ____________________________________________   LABS _ 0 Result Notes         Component Ref Range & Units 6 d ago (11/23/22) 1 yr ago (11/24/21) 2 yr ago (11/24/20) 2 yr ago (12/15/19) 2 yr ago (12/02/19)  Color, UA  yellow dark yelllow Dark Yellow Dark Yellow Yellow  Clarity, UA  clear clear Clear Clear Clear  Glucose, UA Negative Negative Negative Negative Negative Negative  Bilirubin, UA  neg negative Negative Negative Negative  Ketones, UA  neg negative Negative Negative Negative  Spec Grav, UA 1.010 - 1.025 1.025 1.020 1.025  1.025 1.025  Blood, UA  neg negative Negative Negative Negative  pH, UA 5.0 - 8.0 6.0 6.0 6.0 6.0 6.0  Protein, UA Negative Negative Positive Abnormal  CM Negative Negative Negative  Urobilinogen, UA 0.2 or 1.0 E.U./dL 1.0 0.2 0.2 0.2 0.2  Nitrite, UA  neg negative Negative Negative Negative  Leukocytes, UA Negative Negative Negative Negative Negative Negative  Appearance   dark     Odor               Visible to patient: Yes (seen)      Next appt: 12/22/2022 at 08:30 AM in [facility] ([facility])      Dx: Routine adult health maintenance    0 Result Notes             Component Ref Range & Units 6 d ago (11/23/22) 1 yr ago (11/24/21) 2 yr ago (11/24/20) 2 yr ago (12/15/19) 7 yr ago (12/24/14) 7 yr ago (12/24/14)  Glucose 70 - 99 mg/dL 110 High  105 High  93 R 104 High  R 94 R   Uric Acid 3.8 - 8.4 mg/dL 6.4 6.0 CM 4.9 CM 4.5 CM    Comment:            Therapeutic target for gout patients: <6.0  BUN 6 - 24 mg/dL '16 18 14 15 14 '$ R   Creatinine, Ser 0.76 - 1.27 mg/dL 1.26 1.22 1.13 1.16 1.15 R   eGFR >59 mL/min/1.73 67 70      BUN/Creatinine Ratio 9 - '20 13 15 12 13    '$ Sodium 134 - 144 mmol/L 142 141 139 141 141 R   Potassium 3.5 - 5.2 mmol/L 2.8 Low  3.5 3.9 3.9 3.8 R   Chloride 96 - 106 mmol/L 97 98 98 101 105 R   Calcium 8.7 - 10.2 mg/dL 9.2 9.9 9.6 9.1 8.9 R   Phosphorus 2.8 - 4.1 mg/dL 3.3 3.7 3.5 3.2    Total Protein 6.0 - 8.5 g/dL 6.9 6.5 7.1 6.8    Albumin 3.8 - 4.9 g/dL 4.6 4.5 4.7 4.4    Globulin, Total 1.5 - 4.5 g/dL 2.3 2.0 2.4 2.4    Albumin/Globulin Ratio 1.2 - 2.2 2.0 2.3 High  2.0 1.8    Bilirubin Total 0.0 - 1.2 mg/dL 1.7 High  1.4 High  1.4 High  1.5 High     Alkaline Phosphatase 44 - 121 IU/L 109 115 135 High  CM 124 High  R    LDH 121 - 224 IU/L 146 146 167 197    AST 0 - 40 IU/L '23 22 25 20    '$ ALT 0 - 44 IU/L 32 27 40 34    GGT 0 - 65 IU/L 33 33 49 37    Iron 38 - 169 ug/dL 147 98 99 135    Cholesterol, Total 100 - 199 mg/dL 163 168 199 177     Triglycerides 0 - 149 mg/dL 114 122 126 111    HDL >39 mg/dL 39 Low  36 Low  39 Low  40    VLDL Cholesterol Cal 5 - 40 mg/dL '21 22 23 20    '$ LDL Chol Calc (NIH) 0 - 99 mg/dL 103 High  110 High  137 High  117 High     Chol/HDL Ratio 0.0 - 5.0 ratio 4.2 4.7 CM 5.1 High  CM 4.4 CM    Comment:                                   T. Chol/HDL Ratio                                             Men  Women                               1/2 Avg.Risk  3.4    3.3                                   Avg.Risk  5.0    4.4                                2X Avg.Risk  9.6    7.1                                3X Avg.Risk 23.4   11.0  Estimated CHD Risk 0.0 - 1.0 times avg. 0.8 0.9 CM 1.1 High  CM 0.9 CM    Comment: The CHD Risk is based on the T. Chol/HDL ratio. Other factors affect CHD Risk such as hypertension, smoking, diabetes, severe obesity, and family history of premature CHD.  TSH 0.450 - 4.500 uIU/mL 3.040 2.690 2.750 2.980    T4, Total 4.5 - 12.0 ug/dL 7.9 7.8 7.7 6.7    T3 Uptake Ratio 24 - 39 % '26 26 24 22 '$ Low     Free Thyroxine Index 1.2 - 4.9 2.1 2.0 1.8 1.5    Prostate Specific Ag, Serum 0.0 - 4.0 ng/mL 0.8 0.7 CM 0.7 CM 0.9 CM    Comment: Roche ECLIA methodology. According to the American Urological Association, Serum PSA should decrease and remain at undetectable levels after radical prostatectomy. The AUA defines biochemical recurrence as an initial PSA value 0.2 ng/mL or greater followed by a subsequent confirmatory PSA value 0.2 ng/mL or greater. Values obtained with different assay methods or kits cannot be used interchangeably. Results cannot be interpreted as absolute evidence of the presence or absence of malignant disease.  WBC 3.4 - 10.8 x10E3/uL 7.2 7.6 9.3 8.1  7.0 R  RBC 4.14 - 5.80 x10E6/uL 5.30 5.04 5.57 5.53  5.14 R  Hemoglobin 13.0 - 17.7 g/dL 15.7 15.7 17.1 17.2    Hematocrit 37.5 - 51.0 % 47.4 45.5 50.4 50.1    MCV 79 - 97 fL 89 90 91 91  90 R  MCH 26.6 - 33.0 pg 29.6  31.2 30.7 31.1  30.4 R  MCHC 31.5 - 35.7 g/dL 33.1 34.5 33.9 34.3  33.8 R  RDW 11.6 - 15.4 % 13.0 12.8 13.2 13.4  14.0 R  Platelets 150 - 450 x10E3/uL 206 199 219 198  172 R  Neutrophils Not Estab. % 68 68 69 69    Lymphs Not Estab. % 22  $'21 18 19    'l$ Monocytes Not Estab. % '8 8 8 8    '$ Eos Not Estab. % '1 2 3 2    '$ Basos Not Estab. % '1 1 1 1    '$ Neutrophils Absolute 1.4 - 7.0 x10E3/uL 4.8 5.2 6.5 5.7    Lymphocytes Absolute 0.7 - 3.1 x10E3/uL 1.6 1.6 1.7 1.5    Monocytes Absolute 0.1 - 0.9 x10E3/uL 0.6 0.6 0.7 0.6    EOS (ABSOLUTE) 0.0 - 0.4 x10E3/uL 0.1 0.1 0.3 0.1    Basophils Absolute 0.0 - 0.2 x10E3/uL 0.1 0.1 0.1 0.0    Immature Granulocytes Not Estab. % 0 0 1 1                 ___________________________________________  EKG  Asymptomatic nonspecific ST depressions with 80 bpm. ____________________________________________    ____________________________________________   INITIAL IMPRESSION / ASSESSMENT AND PLAN  As part of my medical decision making, I reviewed the following data within the electronic MEDICAL RECORD NUMBER       No acute findings on physical exam, labs, and EKG. Consult to gastroenterology and dermatology per request.     ____________________________________________   FINAL CLINICAL IMPRESSION  Well exam.   ED Discharge Orders     None        Note:  This document was prepared using Dragon voice recognition software and may include unintentional dictation errors.

## 2022-11-29 NOTE — Progress Notes (Signed)
Pt presents today to complete physical, Pt would like to go over labs he has certain concerns. Pt requesting referral for gastroenterology for colonoscopy.

## 2022-12-01 NOTE — Addendum Note (Signed)
Addended by: Aliene Altes on: 12/01/2022 10:01 AM   Modules accepted: Orders

## 2022-12-22 ENCOUNTER — Ambulatory Visit: Payer: Self-pay

## 2022-12-22 DIAGNOSIS — Z0110 Encounter for examination of ears and hearing without abnormal findings: Secondary | ICD-10-CM

## 2023-02-01 ENCOUNTER — Other Ambulatory Visit: Payer: Self-pay

## 2023-02-01 DIAGNOSIS — I10 Essential (primary) hypertension: Secondary | ICD-10-CM

## 2023-02-01 MED ORDER — PRAZOSIN HCL 1 MG PO CAPS: 1.0000 mg | ORAL_CAPSULE | Freq: Every day | ORAL | 3 refills | Status: DC

## 2023-02-01 MED ORDER — METOLAZONE 2.5 MG PO TABS: 2.5000 mg | ORAL_TABLET | Freq: Every day | ORAL | 3 refills | Status: DC

## 2023-02-15 DIAGNOSIS — D2271 Melanocytic nevi of right lower limb, including hip: Secondary | ICD-10-CM | POA: Diagnosis not present

## 2023-02-15 DIAGNOSIS — D2261 Melanocytic nevi of right upper limb, including shoulder: Secondary | ICD-10-CM | POA: Diagnosis not present

## 2023-02-15 DIAGNOSIS — D2262 Melanocytic nevi of left upper limb, including shoulder: Secondary | ICD-10-CM | POA: Diagnosis not present

## 2023-02-15 DIAGNOSIS — L814 Other melanin hyperpigmentation: Secondary | ICD-10-CM | POA: Diagnosis not present

## 2023-02-15 DIAGNOSIS — L578 Other skin changes due to chronic exposure to nonionizing radiation: Secondary | ICD-10-CM | POA: Diagnosis not present

## 2023-02-15 DIAGNOSIS — D225 Melanocytic nevi of trunk: Secondary | ICD-10-CM | POA: Diagnosis not present

## 2023-02-15 DIAGNOSIS — L821 Other seborrheic keratosis: Secondary | ICD-10-CM | POA: Diagnosis not present

## 2023-02-15 DIAGNOSIS — D2272 Melanocytic nevi of left lower limb, including hip: Secondary | ICD-10-CM | POA: Diagnosis not present

## 2023-03-06 ENCOUNTER — Other Ambulatory Visit: Payer: Self-pay

## 2023-03-06 NOTE — Progress Notes (Signed)
Random UDS completed for [facility] CDL.

## 2023-07-30 ENCOUNTER — Encounter: Payer: Self-pay | Admitting: Physician Assistant

## 2023-07-30 ENCOUNTER — Ambulatory Visit: Payer: Self-pay | Admitting: Physician Assistant

## 2023-07-30 VITALS — BP 150/99 | HR 79 | Temp 98.4°F | Resp 16 | Ht 70.0 in | Wt 225.0 lb

## 2023-07-30 DIAGNOSIS — S61212A Laceration without foreign body of right middle finger without damage to nail, initial encounter: Secondary | ICD-10-CM

## 2023-07-30 NOTE — Progress Notes (Signed)
Workers comp injury of laceration to left middle finger today dorsal side approximately 2cm long stated cut with ceramic.  Area cleansed with NSS and Betadine and pressure applied and tolerated well with moderate bleeding.

## 2023-07-30 NOTE — Progress Notes (Signed)
   Subjective: Left finger laceration    Patient ID: Jimmy Mccall, male    DOB: 02/05/1966, 57 y.o.   MRN: [id number]  HPI Patient presents with a 1.5 cm laceration to the dorsal aspect of the third digit left hand.  Finger was cut by a ceramic tile.  Bleeding is controlled with direct pressure.  Patient denies loss sensation or loss of function.  Patient is right-hand dominant.   Review of Systems Negative except for above complaint    Objective:   Physical Exam       BP 150/99  Pulse 79  Resp 16  Temp 98.4 F (36.9 C)  SpO2 96 %  Weight 225 lb (102.1 kg)  Height 5\' 10"  (1.778 m)    BMI 32.28 kg/m2  BSA 2.25 m2  No acute distress. 1.5 cm laceration dorsal aspect of third digit left hand.  Neurovascular intact with free Nikkel range of motion. Assessment & Plan: Finger laceration  Area was thoroughly cleaned and closed with Dermabond.  Patient given a finger splint and advised to limit use of the left hand for 1 week.  Advised to follow-up 1 week for reevaluation.  Sooner if condition worsens.

## 2023-08-06 ENCOUNTER — Ambulatory Visit: Payer: Self-pay | Admitting: Physician Assistant

## 2023-08-06 ENCOUNTER — Encounter: Payer: Self-pay | Admitting: Physician Assistant

## 2023-08-06 VITALS — BP 131/84 | HR 68 | Temp 98.0°F | Resp 16

## 2023-08-06 DIAGNOSIS — Z5189 Encounter for other specified aftercare: Secondary | ICD-10-CM

## 2023-08-06 NOTE — Progress Notes (Signed)
Subjective: Wound check    Patient ID: Jimmy Mccall, male    DOB: Jul 13, 1966, 57 y.o.   MRN: [id number]  HPI Patient follow-up secondary to laceration of third digit right hand.  Patient was seen on 07/30/2023 and wound was closed with Dermabond.  Patient voices no complaints.   Review of Systems Negative septa above complaint    Objective:   Physical Exam BP 131/84  Pulse 68  Resp 16  Temp 98 F (36.7 C)  SpO2 95 %  No loss deformity to the third digit right hand.  Wound is close follow-up edema or erythema.  Patient has full and equal range of motion with flexion extension.  Neurovascular intact.       Assessment & Plan: Wound check   Continue wound care as directed.  Follow-up as wound we will plan for healing is complete.

## 2023-08-06 NOTE — Progress Notes (Signed)
Follow up laceration to left middle finger last week and note area appears approximated and scabbed with no drainage at this time or any sign of infection.  Reminded to use good hand hygiene and protect newly healing skin with bandaid while working.  Stated no complaints.

## 2023-08-16 ENCOUNTER — Encounter: Payer: Self-pay | Admitting: Gastroenterology

## 2023-08-17 ENCOUNTER — Ambulatory Visit: Payer: 59 | Admitting: Certified Registered"

## 2023-08-17 ENCOUNTER — Encounter: Payer: Self-pay | Admitting: Gastroenterology

## 2023-08-17 ENCOUNTER — Other Ambulatory Visit: Payer: Self-pay

## 2023-08-17 ENCOUNTER — Ambulatory Visit
Admission: RE | Admit: 2023-08-17 | Discharge: 2023-08-17 | Disposition: A | Discharge status: Home / Self Care | Payer: 59 | Attending: Gastroenterology | Admitting: Gastroenterology

## 2023-08-17 ENCOUNTER — Encounter
Admission: RE | Disposition: A | Discharge status: Home / Self Care | Payer: Self-pay | Source: Home / Self Care | Attending: Gastroenterology

## 2023-08-17 DIAGNOSIS — K5730 Diverticulosis of large intestine without perforation or abscess without bleeding: Secondary | ICD-10-CM | POA: Insufficient documentation

## 2023-08-17 DIAGNOSIS — Z6834 Body mass index (BMI) 34.0-34.9, adult: Secondary | ICD-10-CM | POA: Diagnosis not present

## 2023-08-17 DIAGNOSIS — D124 Benign neoplasm of descending colon: Secondary | ICD-10-CM | POA: Diagnosis not present

## 2023-08-17 DIAGNOSIS — Z1211 Encounter for screening for malignant neoplasm of colon: Secondary | ICD-10-CM | POA: Diagnosis not present

## 2023-08-17 DIAGNOSIS — D125 Benign neoplasm of sigmoid colon: Secondary | ICD-10-CM | POA: Insufficient documentation

## 2023-08-17 DIAGNOSIS — Z9049 Acquired absence of other specified parts of digestive tract: Secondary | ICD-10-CM | POA: Diagnosis not present

## 2023-08-17 DIAGNOSIS — D122 Benign neoplasm of ascending colon: Secondary | ICD-10-CM | POA: Insufficient documentation

## 2023-08-17 DIAGNOSIS — I10 Essential (primary) hypertension: Secondary | ICD-10-CM | POA: Diagnosis not present

## 2023-08-17 DIAGNOSIS — D123 Benign neoplasm of transverse colon: Secondary | ICD-10-CM | POA: Diagnosis not present

## 2023-08-17 DIAGNOSIS — E669 Obesity, unspecified: Secondary | ICD-10-CM | POA: Diagnosis not present

## 2023-08-17 HISTORY — PX: POLYPECTOMY: SHX5525

## 2023-08-17 HISTORY — PX: COLONOSCOPY WITH PROPOFOL: SHX5780

## 2023-08-17 SURGERY — COLONOSCOPY WITH PROPOFOL
Anesthesia: General

## 2023-08-17 MED ORDER — SODIUM CHLORIDE 0.9 % IV SOLN: INTRAVENOUS | Status: DC

## 2023-08-17 MED ORDER — LIDOCAINE HCL (PF) 2 % IJ SOLN
INTRAMUSCULAR | Status: AC
  Filled 2023-08-17: qty 5

## 2023-08-17 MED ORDER — LIDOCAINE HCL (CARDIAC) PF 100 MG/5ML IV SOSY
PREFILLED_SYRINGE | INTRAVENOUS | Status: DC | PRN
Start: 2023-08-17 — End: 2023-08-17
  Administered 2023-08-17: 100 mg via INTRAVENOUS

## 2023-08-17 MED ORDER — PROPOFOL 10 MG/ML IV BOLUS
INTRAVENOUS | Status: AC
  Filled 2023-08-17: qty 40

## 2023-08-17 MED ORDER — PROPOFOL 10 MG/ML IV BOLUS
INTRAVENOUS | Status: DC | PRN
Start: 2023-08-17 — End: 2023-08-17
  Administered 2023-08-17: 100 mg via INTRAVENOUS
  Administered 2023-08-17: 110 ug/kg/min via INTRAVENOUS

## 2023-08-17 NOTE — Anesthesia Preprocedure Evaluation (Signed)
Anesthesia Evaluation  Patient identified by MRN, date of birth, ID band Patient awake    Reviewed: Allergy & Precautions, NPO status , Patient's Chart, lab work & pertinent test results  Airway Mallampati: III  TM Distance: >3 FB Neck ROM: full    Dental  (+) Chipped, Dental Advidsory Given   Pulmonary neg pulmonary ROS   Pulmonary exam normal        Cardiovascular hypertension, On Medications negative cardio ROS Normal cardiovascular exam     Neuro/Psych negative neurological ROS  negative psych ROS   GI/Hepatic negative GI ROS, Neg liver ROS,,,  Endo/Other  negative endocrine ROS    Renal/GU negative Renal ROS  negative genitourinary   Musculoskeletal   Abdominal   Peds  Hematology negative hematology ROS (+)   Anesthesia Other Findings Past Medical History: No date: Elevated LDL cholesterol level No date: Gout No date: Hypertension No date: Kidney stone No date: Low HDL (under 40) No date: Lumbar strain No date: Obesity No date: Seasonal rhinitis No date: Shoulder pain, right  Past Surgical History: No date: CHOLECYSTECTOMY No date: WISDOM TOOTH EXTRACTION  BMI    Body Mass Index: 34.29 kg/m      Reproductive/Obstetrics negative OB ROS                             Anesthesia Physical Anesthesia Plan  ASA: 2  Anesthesia Plan: General   Post-op Pain Management: Minimal or no pain anticipated   Induction: Intravenous  PONV Risk Score and Plan: 3 and Propofol infusion, TIVA and Ondansetron  Airway Management Planned: Nasal Cannula  Additional Equipment: None  Intra-op Plan:   Post-operative Plan:   Informed Consent: I have reviewed the patients History and Physical, chart, labs and discussed the procedure including the risks, benefits and alternatives for the proposed anesthesia with the patient or authorized representative who has indicated his/her  understanding and acceptance.     Dental advisory given  Plan Discussed with: CRNA and Surgeon  Anesthesia Plan Comments: (Discussed risks of anesthesia with patient, including possibility of difficulty with spontaneous ventilation under anesthesia necessitating airway intervention, PONV, and rare risks such as cardiac or respiratory or neurological events, and allergic reactions. Discussed the role of CRNA in patient's perioperative care. Patient understands.)       Anesthesia Quick Evaluation

## 2023-08-17 NOTE — Op Note (Signed)
[facility] Patient Name: Jimmy Mccall Procedure Date: 08/17/2023 7:58 AM MRN: [id number] Account #: [id number] Date of Birth: 08-Apr-1966 Admit Type: Outpatient Age: 57 Room: [facility] ENDO ROOM 1 Gender: Male Note Status: Finalized Instrument Name: Colonoscope [id number] Procedure:             Colonoscopy Indications:           Screening for colorectal malignant neoplasm Providers:             Trenda Moots, DO Referring MD:          Jaynie Collins DO, DO (Referring MD), Joni Reining, MD (Referring MD) Medicines:             Monitored Anesthesia Care Complications:         No immediate complications. Estimated blood loss:                         Minimal. Procedure:             Pre-Anesthesia Assessment:                        - Prior to the procedure, a History and Physical was                         performed, and patient medications and allergies were                         reviewed. The patient is competent. The risks and                         benefits of the procedure and the sedation options and                         risks were discussed with the patient. All questions                         were answered and informed consent was obtained.                         Patient identification and proposed procedure were                         verified by the physician, the nurse, the anesthetist                         and the technician in the endoscopy suite. Mental                         Status Examination: alert and oriented. Airway                         Examination: normal oropharyngeal airway and neck                         mobility. Respiratory Examination: clear to  auscultation. CV Examination: RRR, no murmurs, no S3                         or S4. Prophylactic Antibiotics: The patient does not                         require prophylactic antibiotics. Prior                          Anticoagulants: The patient has taken no anticoagulant                         or antiplatelet agents. ASA Grade Assessment: II - A                         patient with mild systemic disease. After reviewing                         the risks and benefits, the patient was deemed in                         satisfactory condition to undergo the procedure. The                         anesthesia plan was to use monitored anesthesia care                         (MAC). Immediately prior to administration of                         medications, the patient was re-assessed for adequacy                         to receive sedatives. The heart rate, respiratory                         rate, oxygen saturations, blood pressure, adequacy of                         pulmonary ventilation, and response to care were                         monitored throughout the procedure. The physical                         status of the patient was re-assessed after the                         procedure.                        After obtaining informed consent, the colonoscope was                         passed under direct vision. Throughout the procedure,                         the patient's blood pressure, pulse, and oxygen  saturations were monitored continuously. The                         Colonoscope was introduced through the anus and                         advanced to the the terminal ileum, with                         identification of the appendiceal orifice and IC                         valve. The colonoscopy was performed without                         difficulty. The patient tolerated the procedure well.                         The quality of the bowel preparation was evaluated                         using the BBPS [facility] Bowel Preparation Scale) with                         scores of: Right Colon = 3, Transverse Colon = 3 and                         Left Colon = 3  (entire mucosa seen well with no                         residual staining, small fragments of stool or opaque                         liquid). The total BBPS score equals 9. The terminal                         ileum, ileocecal valve, appendiceal orifice, and                         rectum were photographed. Findings:      The perianal and digital rectal examinations were normal. Pertinent       negatives include normal sphincter tone.      The terminal ileum appeared normal. Estimated blood loss: none.      Retroflexion in the right colon was performed.      Multiple small-mouthed diverticula were found in the recto-sigmoid colon       and sigmoid colon. Estimated blood loss: none.      Four sessile polyps were found in the sigmoid colon (2), transverse       colon (1) and ascending colon (1). The polyps were 1 to 2 mm in size.       These polyps were removed with a jumbo cold forceps. Resection and       retrieval were complete. Estimated blood loss was minimal.      Six sessile polyps were found in the sigmoid colon (1), descending colon       (2), transverse colon (1) and ascending colon (2). The polyps were 3 to  7 mm in size. These polyps were removed with a cold snare. Resection and       retrieval were complete. Estimated blood loss was minimal.      The exam was otherwise without abnormality on direct and retroflexion       views. Impression:            - The examined portion of the ileum was normal.                        - Diverticulosis in the recto-sigmoid colon and in the                         sigmoid colon.                        - Four 1 to 2 mm polyps in the sigmoid colon, in the                         transverse colon and in the ascending colon, removed                         with a jumbo cold forceps. Resected and retrieved.                        - Six 3 to 7 mm polyps in the sigmoid colon, in the                         descending colon, in the transverse  colon and in the                         ascending colon, removed with a cold snare. Resected                         and retrieved.                        - The examination was otherwise normal on direct and                         retroflexion views. Recommendation:        - Patient has a contact number available for                         emergencies. The signs and symptoms of potential                         delayed complications were discussed with the patient.                         Return to normal activities tomorrow. Written                         discharge instructions were provided to the patient.                        - Discharge patient to home.                        -  Resume previous diet.                        - Continue present medications.                        - No ibuprofen, naproxen, or other non-steroidal                         anti-inflammatory drugs for 5 days after polyp removal.                        - Await pathology results.                        - Repeat colonoscopy for surveillance based on                         pathology results.                        - Return to referring physician as previously                         scheduled.                        - The findings and recommendations were discussed with                         the patient. Procedure Code(s):     --- Professional ---                        [id number], Colonoscopy, flexible; with removal of                         tumor(s), polyp(s), or other lesion(s) by snare                         technique                        45380, 59, Colonoscopy, flexible; with biopsy, single                         or multiple Diagnosis Code(s):     --- Professional ---                        Z12.11, Encounter for screening for malignant neoplasm                         of colon                        D12.5, Benign neoplasm of sigmoid colon                        D12.4, Benign neoplasm of descending  colon                        D12.3, Benign neoplasm of transverse colon (hepatic  flexure or splenic flexure)                        D12.2, Benign neoplasm of ascending colon                        K57.30, Diverticulosis of large intestine without                         perforation or abscess without bleeding CPT copyright 2022 American Medical Association. All rights reserved. The codes documented in this report are preliminary and upon coder review may  be revised to meet current compliance requirements. Attending Participation:      I personally performed the entire procedure. Elfredia Nevins, DO Jaynie Collins DO, DO 08/17/2023 8:50:55 AM This report has been signed electronically. Number of Addenda: 0 Note Initiated On: 08/17/2023 7:58 AM Scope Withdrawal Time: 0 hours 18 minutes 0 seconds  Total Procedure Duration: 0 hours 23 minutes 12 seconds  Estimated Blood Loss:  Estimated blood loss was minimal.      [facility]

## 2023-08-17 NOTE — Transfer of Care (Signed)
Immediate Anesthesia Transfer of Care Note  Patient: Jimmy Mccall  Procedure(s) Performed: COLONOSCOPY WITH PROPOFOL POLYPECTOMY  Patient Location: Endoscopy Unit  Anesthesia Type:General  Level of Consciousness: drowsy  Airway & Oxygen Therapy: Patient Spontanous Breathing  Post-op Assessment: Report given to RN and Post -op Vital signs reviewed and stable  Post vital signs: Reviewed and stable  Last Vitals:  Vitals Value Taken Time  BP 105/80 08/17/23 0845  Temp 35.7 0845  Pulse 63 08/17/23 0845  Resp 20 08/17/23 0845  SpO2 99 % 08/17/23 0845    Last Pain:  Vitals:   08/17/23 0845  TempSrc:   PainSc: 0-No pain         Complications: No notable events documented.

## 2023-08-17 NOTE — Anesthesia Postprocedure Evaluation (Signed)
Anesthesia Post Note  Patient: Jimmy Mccall  Procedure(s) Performed: COLONOSCOPY WITH PROPOFOL POLYPECTOMY  Patient location during evaluation: Endoscopy Anesthesia Type: General Level of consciousness: awake and alert Pain management: pain level controlled Vital Signs Assessment: post-procedure vital signs reviewed and stable Respiratory status: spontaneous breathing, nonlabored ventilation, respiratory function stable and patient connected to nasal cannula oxygen Cardiovascular status: blood pressure returned to baseline and stable Postop Assessment: no apparent nausea or vomiting Anesthetic complications: no  No notable events documented.   Last Vitals:  Vitals:   08/17/23 0807 08/17/23 0845  BP: (!) 143/85 105/80  Pulse: 72 63  Resp: 18 20  Temp: (!) 36.2 C   SpO2: 96% 99%    Last Pain:  Vitals:   08/17/23 0845  TempSrc:   PainSc: 0-No pain                 Stephanie Coup

## 2023-08-17 NOTE — H&P (Signed)
Pre-Procedure H&P   Patient ID: Jimmy Mccall is a 57 y.o. male.  Gastroenterology Provider: Jaynie Collins, DO  Referring Provider: Dr. Katrinka Blazing PCP: Patient, No Pcp Per  Date: 08/17/2023  HPI Jimmy Mccall is a 57 y.o. male who presents today for Colonoscopy for Colorectal cancer screening .  Initial screening colonoscopy.  No family history of colon cancer or colon polyps.  Reports every other day bowel movement without melena or hematochezia. Hemoglobin 15.7 MCV 89 platelets 206,000 creatinine 1.26 Status post cholecystectomy   Past Medical History:  Diagnosis Date   Elevated LDL cholesterol level    Gout    Hypertension    Kidney stone    Low HDL (under 40)    Lumbar strain    Obesity    Seasonal rhinitis    Shoulder pain, right     Past Surgical History:  Procedure Laterality Date   CHOLECYSTECTOMY     WISDOM TOOTH EXTRACTION      Family History No h/o GI disease or malignancy  Review of Systems  Constitutional:  Negative for activity change, appetite change, chills, diaphoresis, fatigue, fever and unexpected weight change.  HENT:  Negative for trouble swallowing and voice change.   Respiratory:  Negative for shortness of breath and wheezing.   Cardiovascular:  Negative for chest pain, palpitations and leg swelling.  Gastrointestinal:  Negative for abdominal distention, abdominal pain, anal bleeding, blood in stool, constipation, diarrhea, nausea and vomiting.  Musculoskeletal:  Negative for arthralgias and myalgias.  Skin:  Negative for color change and pallor.  Neurological:  Negative for dizziness, syncope and weakness.  Psychiatric/Behavioral:  Negative for confusion. The patient is not nervous/anxious.   All other systems reviewed and are negative.    Medications No current facility-administered medications on file prior to encounter.   Current Outpatient Medications on File Prior to Encounter  Medication Sig Dispense  Refill   allopurinol (ZYLOPRIM) 300 MG tablet Take 1 tablet (300 mg total) by mouth daily. 90 tablet 3   metolazone (ZAROXOLYN) 2.5 MG tablet Take 1 tablet (2.5 mg total) by mouth daily. 90 tablet 3   prazosin (MINIPRESS) 1 MG capsule Take 1 capsule (1 mg total) by mouth at bedtime. 90 capsule 3    Pertinent medications related to GI and procedure were reviewed by me with the patient prior to the procedure   Current Facility-Administered Medications:    0.9 %  sodium chloride infusion, , Intravenous, Continuous, Jaynie Collins, DO, Last Rate: 20 mL/hr at 08/17/23 0810, New Bag at 08/17/23 0810  sodium chloride 20 mL/hr at 08/17/23 0810       No Known Allergies Allergies were reviewed by me prior to the procedure  Objective   Body mass index is 34.29 kg/m. Vitals:   08/17/23 0807  BP: (!) 143/85  Pulse: 72  Resp: 18  Temp: (!) 97.2 F (36.2 C)  TempSrc: Temporal  SpO2: 96%  Weight: 108.4 kg  Height: 5\' 10"  (1.778 m)     Physical Exam Vitals and nursing note reviewed.  Constitutional:      General: He is not in acute distress.    Appearance: Normal appearance. He is obese. He is not ill-appearing, toxic-appearing or diaphoretic.  HENT:     Head: Normocephalic and atraumatic.     Nose: Nose normal.     Mouth/Throat:     Mouth: Mucous membranes are moist.     Pharynx: Oropharynx is clear.  Eyes:  General: No scleral icterus.    Extraocular Movements: Extraocular movements intact.  Cardiovascular:     Rate and Rhythm: Normal rate and regular rhythm.     Heart sounds: Normal heart sounds. No murmur heard.    No friction rub. No gallop.  Pulmonary:     Effort: Pulmonary effort is normal. No respiratory distress.     Breath sounds: Normal breath sounds. No wheezing, rhonchi or rales.  Abdominal:     General: Bowel sounds are normal. There is no distension.     Palpations: Abdomen is soft.     Tenderness: There is no abdominal tenderness. There is no  guarding or rebound.  Musculoskeletal:     Cervical back: Neck supple.     Right lower leg: No edema.     Left lower leg: No edema.  Skin:    General: Skin is warm and dry.     Coloration: Skin is not jaundiced or pale.  Neurological:     General: No focal deficit present.     Mental Status: He is alert and oriented to person, place, and time. Mental status is at baseline.  Psychiatric:        Mood and Affect: Mood normal.        Behavior: Behavior normal.        Thought Content: Thought content normal.        Judgment: Judgment normal.      Assessment:  Jimmy Mccall is a 57 y.o. male  who presents today for Colonoscopy for Colorectal cancer screening .  Plan:  Colonoscopy with possible intervention today  Colonoscopy with possible biopsy, control of bleeding, polypectomy, and interventions as necessary has been discussed with the patient/patient representative. Informed consent was obtained from the patient/patient representative after explaining the indication, nature, and risks of the procedure including but not limited to death, bleeding, perforation, missed neoplasm/lesions, cardiorespiratory compromise, and reaction to medications. Opportunity for questions was given and appropriate answers were provided. Patient/patient representative has verbalized understanding is amenable to undergoing the procedure.   Jaynie Collins, DO  [facility]  Portions of the record may have been created with voice recognition software. Occasional wrong-word or 'sound-a-like' substitutions may have occurred due to the inherent limitations of voice recognition software.  Read the chart carefully and recognize, using context, where substitutions may have occurred.

## 2023-08-17 NOTE — Interval H&P Note (Signed)
History and Physical Interval Note: Preprocedure H&P from 08/17/23  was reviewed and there was no interval change after seeing and examining the patient.  Written consent was obtained from the patient after discussion of risks, benefits, and alternatives. Patient has consented to proceed with Colonoscopy with possible intervention   08/17/2023 8:13 AM  Jimmy Mccall  has presented today for surgery, with the diagnosis of Z12.11 (ICD-10-CM) - Colon cancer screening.  The various methods of treatment have been discussed with the patient and family. After consideration of risks, benefits and other options for treatment, the patient has consented to  Procedure(s): COLONOSCOPY WITH PROPOFOL (N/A) as a surgical intervention.  The patient's history has been reviewed, patient examined, no change in status, stable for surgery.  I have reviewed the patient's chart and labs.  Questions were answered to the patient's satisfaction.     Jaynie Collins

## 2023-08-20 ENCOUNTER — Encounter: Payer: Self-pay | Admitting: Gastroenterology

## 2023-08-20 LAB — SURGICAL PATHOLOGY

## 2023-10-31 ENCOUNTER — Other Ambulatory Visit: Payer: Self-pay

## 2023-10-31 DIAGNOSIS — M109 Gout, unspecified: Secondary | ICD-10-CM

## 2023-10-31 MED ORDER — ALLOPURINOL 300 MG PO TABS: 300.0000 mg | ORAL_TABLET | Freq: Every day | ORAL | 3 refills | Status: DC

## 2023-11-27 ENCOUNTER — Ambulatory Visit: Payer: Self-pay

## 2023-11-27 DIAGNOSIS — Z0000 Encounter for general adult medical examination without abnormal findings: Secondary | ICD-10-CM

## 2023-11-27 LAB — POCT URINALYSIS DIPSTICK
Bilirubin, UA: NEGATIVE
Blood, UA: NEGATIVE
Glucose, UA: NEGATIVE
Ketones, UA: NEGATIVE
Leukocytes, UA: NEGATIVE
Nitrite, UA: NEGATIVE
Protein, UA: NEGATIVE
Spec Grav, UA: 1.015 (ref 1.010–1.025)
Urobilinogen, UA: 0.2 U/dL
pH, UA: 6 (ref 5.0–8.0)

## 2023-11-27 NOTE — Progress Notes (Signed)
Pt completed labs for scheduled physical. Jimmy Mccall

## 2023-11-28 LAB — CMP12+LP+TP+TSH+6AC+PSA+CBC…
ALT: 31 [IU]/L (ref 0–44)
AST: 24 [IU]/L (ref 0–40)
Albumin: 4.4 g/dL (ref 3.8–4.9)
Alkaline Phosphatase: 114 [IU]/L (ref 44–121)
BUN/Creatinine Ratio: 12 (ref 9–20)
BUN: 14 mg/dL (ref 6–24)
Basophils Absolute: 0.1 10*3/uL (ref 0.0–0.2)
Basos: 1 %
Bilirubin Total: 1.3 mg/dL — ABNORMAL HIGH (ref 0.0–1.2)
Calcium: 9.3 mg/dL (ref 8.7–10.2)
Chloride: 97 mmol/L (ref 96–106)
Chol/HDL Ratio: 4.9 {ratio} (ref 0.0–5.0)
Cholesterol, Total: 178 mg/dL (ref 100–199)
Creatinine, Ser: 1.14 mg/dL (ref 0.76–1.27)
EOS (ABSOLUTE): 0.1 10*3/uL (ref 0.0–0.4)
Eos: 2 %
Estimated CHD Risk: 1 times avg. (ref 0.0–1.0)
Free Thyroxine Index: 2.3 (ref 1.2–4.9)
GGT: 36 [IU]/L (ref 0–65)
Globulin, Total: 2.4 g/dL (ref 1.5–4.5)
Glucose: 121 mg/dL — ABNORMAL HIGH (ref 70–99)
HDL: 36 mg/dL — ABNORMAL LOW (ref >39)
Hematocrit: 47.5 % (ref 37.5–51.0)
Hemoglobin: 15.9 g/dL (ref 13.0–17.7)
Immature Grans (Abs): 0.1 10*3/uL (ref 0.0–0.1)
Immature Granulocytes: 1 %
Iron: 95 ug/dL (ref 38–169)
LDH: 165 [IU]/L (ref 121–224)
LDL Chol Calc (NIH): 116 mg/dL — ABNORMAL HIGH (ref 0–99)
Lymphocytes Absolute: 1.7 10*3/uL (ref 0.7–3.1)
Lymphs: 21 %
MCH: 30.8 pg (ref 26.6–33.0)
MCHC: 33.5 g/dL (ref 31.5–35.7)
MCV: 92 fL (ref 79–97)
Monocytes Absolute: 0.6 10*3/uL (ref 0.1–0.9)
Monocytes: 8 %
Neutrophils Absolute: 5.5 10*3/uL (ref 1.4–7.0)
Neutrophils: 67 %
Phosphorus: 3.7 mg/dL (ref 2.8–4.1)
Platelets: 204 10*3/uL (ref 150–450)
Potassium: 3.5 mmol/L (ref 3.5–5.2)
Prostate Specific Ag, Serum: 1.1 ng/mL (ref 0.0–4.0)
RBC: 5.16 x10E6/uL (ref 4.14–5.80)
RDW: 13.1 % (ref 11.6–15.4)
Sodium: 142 mmol/L (ref 134–144)
T3 Uptake Ratio: 26 % (ref 24–39)
T4, Total: 8.8 ug/dL (ref 4.5–12.0)
TSH: 2.82 u[IU]/mL (ref 0.450–4.500)
Total Protein: 6.8 g/dL (ref 6.0–8.5)
Triglycerides: 145 mg/dL (ref 0–149)
Uric Acid: 6.4 mg/dL (ref 3.8–8.4)
VLDL Cholesterol Cal: 26 mg/dL (ref 5–40)
WBC: 8.1 10*3/uL (ref 3.4–10.8)
eGFR: 75 mL/min/{1.73_m2} (ref >59)

## 2023-11-29 ENCOUNTER — Encounter: Payer: Self-pay | Admitting: Physician Assistant

## 2023-11-29 ENCOUNTER — Other Ambulatory Visit: Payer: Self-pay | Admitting: Physician Assistant

## 2023-11-29 ENCOUNTER — Ambulatory Visit: Payer: Self-pay | Admitting: Physician Assistant

## 2023-11-29 VITALS — BP 136/81 | HR 76 | Temp 98.6°F | Resp 16 | Ht 70.0 in | Wt 235.0 lb

## 2023-11-29 DIAGNOSIS — Z0000 Encounter for general adult medical examination without abnormal findings: Secondary | ICD-10-CM

## 2023-11-29 DIAGNOSIS — E0800 Diabetes mellitus due to underlying condition with hyperosmolarity without nonketotic hyperglycemic-hyperosmolar coma (NKHHC): Secondary | ICD-10-CM

## 2023-11-29 DIAGNOSIS — E119 Type 2 diabetes mellitus without complications: Secondary | ICD-10-CM | POA: Insufficient documentation

## 2023-11-29 HISTORY — DX: Type 2 diabetes mellitus without complications: E11.9

## 2023-11-29 LAB — HGB A1C W/O EAG: Hgb A1c MFr Bld: 7.5 % — ABNORMAL HIGH (ref 4.8–5.6)

## 2023-11-29 LAB — SPECIMEN STATUS REPORT

## 2023-11-29 MED ORDER — METFORMIN HCL 1000 MG PO TABS: 1000.0000 mg | ORAL_TABLET | Freq: Two times a day (BID) | ORAL | 3 refills | Status: DC

## 2023-11-29 NOTE — Progress Notes (Signed)
 Pt presents today to complete physical, Pt didn't voice any concerns at this time Jimmy Mccall

## 2023-11-29 NOTE — Progress Notes (Signed)
 [facility] [place] [facility]   ____________________________________________   None    (approximate)  I have reviewed the triage vital signs and the nursing notes.   HISTORY  Chief Complaint Annual Exam   HPI Jimmy Mccall is a 58 y.o. male patient presents for annual physical exam.  Patient voices no concerns or complaints.       Past Medical History:  Diagnosis Date   Elevated LDL cholesterol level    Gout    Hypertension    Kidney stone    Low HDL (under 40)    Lumbar strain    Obesity    Seasonal rhinitis    Shoulder pain, right     Patient Active Problem List   Diagnosis Date Noted   Hypertension, uncontrolled 12/01/2020   Routine adult health maintenance 12/01/2020   Idiopathic gout 12/01/2020    Past Surgical History:  Procedure Laterality Date   CHOLECYSTECTOMY     COLONOSCOPY WITH PROPOFOL  N/A 08/17/2023   Procedure: COLONOSCOPY WITH PROPOFOL ;  Surgeon: Onita Elspeth Sharper, DO;  Location: [facility] ENDOSCOPY;  Service: Gastroenterology;  Laterality: N/A;   POLYPECTOMY  08/17/2023   Procedure: POLYPECTOMY;  Surgeon: Onita Elspeth Sharper, DO;  Location: [facility] ENDOSCOPY;  Service: Gastroenterology;;   WISDOM TOOTH EXTRACTION      Prior to Admission medications   Medication Sig Start Date End Date Taking? Authorizing Provider  metFORMIN  (GLUCOPHAGE ) 1000 MG tablet Take 1 tablet (1,000 mg total) by mouth 2 (two) times daily with a meal. 11/29/23  Yes Claudene Tanda POUR, PA-C  allopurinol  (ZYLOPRIM ) 300 MG tablet Take 1 tablet (300 mg total) by mouth daily. 10/31/23   Claudene Tanda POUR, PA-C  fexofenadine (ALLEGRA) 60 MG tablet Take by mouth.    [provider]  metolazone  (ZAROXOLYN ) 2.5 MG tablet Take 1 tablet (2.5 mg total) by mouth daily. 02/01/23   Claudene Tanda POUR, PA-C  prazosin  (MINIPRESS ) 1 MG capsule Take 1 capsule (1 mg total) by mouth at bedtime. 02/01/23   Claudene Tanda POUR, PA-C    Allergies Patient has no known  allergies.  No family history on file.  Social History Social History   Tobacco Use   Smoking status: Never   Smokeless tobacco: Never  Vaping Use   Vaping status: Never Used  Substance Use Topics   Alcohol use: Never   Drug use: Never    Review of Systems Constitutional: No fever/chills Eyes: No visual changes. ENT: No sore throat. Cardiovascular: Denies chest pain. Respiratory: Denies shortness of breath. Gastrointestinal: No abdominal pain.  No nausea, no vomiting.  No diarrhea.  No constipation. Genitourinary: Negative for dysuria. Musculoskeletal: Negative for back pain. Skin: Negative for rash. Neurological: Negative for headaches, focal weakness or numbness. Endocrine: Hypertension ____________________________________________   PHYSICAL EXAM:  VITAL SIGNS: BP 136/81  Cuff Size Large  Pulse Rate 76  Temp 98.6 F (37 C)  Temp Source Temporal  Weight 235 lb (106.6 kg)  Height 5' 10 (1.778 m)  Resp 16  SpO2 96 %   Constitutional: Alert and oriented. Well appearing and in no acute distress. Eyes: Conjunctivae are normal. PERRL. EOMI. Head: Atraumatic. Nose: No congestion/rhinnorhea. Mouth/Throat: Mucous membranes are moist.  Oropharynx non-erythematous. Neck: No stridor.  No cervical spine tenderness to palpation. Hematological/Lymphatic/Immunilogical: No cervical lymphadenopathy. Cardiovascular: Normal rate, regular rhythm. Grossly normal heart sounds.  Good peripheral circulation. Respiratory: Normal respiratory effort.  No retractions. Lungs CTAB. Gastrointestinal: Soft and nontender. No distention. No abdominal bruits. No CVA tenderness. Genitourinary:  Deferred Musculoskeletal: No lower extremity tenderness nor edema.  No joint effusions. Neurologic:  Normal speech and language. No gross focal neurologic deficits are appreciated. No gait instability. Skin:  Skin is warm, dry and intact. No rash noted. Psychiatric: Mood and affect are normal. Speech  and behavior are normal.  ____________________________________________   LABS          Component Ref Range & Units (hover) 2 d ago (11/27/23) 1 yr ago (11/23/22) 2 yr ago (11/24/21) 3 yr ago (11/24/20) 3 yr ago (12/15/19) 3 yr ago (12/02/19)  Color, UA yellow yellow dark yelllow Dark Yellow Dark Yellow Yellow  Clarity, UA clear clear clear Clear Clear Clear  Glucose, UA Negative Negative Negative Negative Negative Negative  Bilirubin, UA neg neg negative Negative Negative Negative  Ketones, UA neg neg negative Negative Negative Negative  Spec Grav, UA 1.015 1.025 1.020 1.025 1.025 1.025  Blood, UA neg neg negative Negative Negative Negative  pH, UA 6.0 6.0 6.0 6.0 6.0 6.0  Protein, UA Negative Negative Positive Abnormal  CM Negative Negative Negative  Urobilinogen, UA 0.2 1.0 0.2 0.2 0.2 0.2  Nitrite, UA neg neg negative Negative Negative Negative  Leukocytes, UA Negative Negative Negative Negative Negative Negative  Appearance   dark     Odor                   View All Conversations on this Encounter              Component Ref Range & Units (hover) 2 d ago (11/27/23) 1 yr ago (11/23/22) 2 yr ago (11/24/21) 3 yr ago (11/24/20) 3 yr ago (12/15/19) 8 yr ago (12/24/14) 8 yr ago (12/24/14)  Glucose 121 High  110 High  105 High  93 R 104 High  R 94 R   Uric Acid 6.4 6.4 CM 6.0 CM 4.9 CM 4.5 CM    Comment:            Therapeutic target for gout patients: <6.0  BUN 14 16 18 14 15 14  R   Creatinine, Ser 1.14 1.26 1.22 1.13 1.16 1.15 R   eGFR 75 67 70      BUN/Creatinine Ratio 12 13 15 12 13     Sodium 142 142 141 139 141 141 R   Potassium 3.5 2.8 Low  3.5 3.9 3.9 3.8 R   Chloride 97 97 98 98 101 105 R   Calcium 9.3 9.2 9.9 9.6 9.1 8.9 R   Phosphorus 3.7 3.3 3.7 3.5 3.2    Total Protein 6.8 6.9 6.5 7.1 6.8    Albumin 4.4 4.6 4.5 4.7 4.4    Globulin, Total 2.4 2.3 2.0 2.4 2.4    Bilirubin Total 1.3 High  1.7 High  1.4 High  1.4 High  1.5 High     Alkaline Phosphatase 114 109 115 135 High   CM 124 High  R    LDH 165 146 146 167 197    AST 24 23 22 25 20     ALT 31 32 27 40 34    GGT 36 33 33 49 37    Iron 95 147 98 99 135    Cholesterol, Total 178 163 168 199 177    Triglycerides 145 114 122 126 111    HDL 36 Low  39 Low  36 Low  39 Low  40    VLDL Cholesterol Cal 26 21 22 23 20     LDL Chol Calc (NIH) 116 High  103 High  110 High  137 High  117 High     Chol/HDL Ratio 4.9 4.2 CM 4.7 CM 5.1 High  CM 4.4 CM    Comment:                                   T. Chol/HDL Ratio                                             Men  Women                               1/2 Avg.Risk  3.4    3.3                                   Avg.Risk  5.0    4.4                                2X Avg.Risk  9.6    7.1                                3X Avg.Risk 23.4   11.0  Estimated CHD Risk 1.0 0.8 CM 0.9 CM 1.1 High  CM 0.9 CM    Comment: The CHD Risk is based on the T. Chol/HDL ratio. Other factors affect CHD Risk such as hypertension, smoking, diabetes, severe obesity, and family history of premature CHD.  TSH 2.820 3.040 2.690 2.750 2.980    T4, Total 8.8 7.9 7.8 7.7 6.7    T3 Uptake Ratio 26 26 26 24 22  Low     Free Thyroxine Index 2.3 2.1 2.0 1.8 1.5    Prostate Specific Ag, Serum 1.1 0.8 CM 0.7 CM 0.7 CM 0.9 CM    Comment: Roche ECLIA methodology. According to the American Urological Association, Serum PSA should decrease and remain at undetectable levels after radical prostatectomy. The AUA defines biochemical recurrence as an initial PSA value 0.2 ng/mL or greater followed by a subsequent confirmatory PSA value 0.2 ng/mL or greater. Values obtained with different assay methods or kits cannot be used interchangeably. Results cannot be interpreted as absolute evidence of the presence or absence of malignant disease.  WBC 8.1 7.2 7.6 9.3 8.1  7.0 R  RBC 5.16 5.30 5.04 5.57 5.53  5.14 R  Hemoglobin 15.9 15.7 15.7 17.1 17.2    Hematocrit 47.5 47.4 45.5 50.4 50.1    MCV 92 89 90 91 91  90 R   MCH 30.8 29.6 31.2 30.7 31.1  30.4 R  MCHC 33.5 33.1 34.5 33.9 34.3  33.8 R  RDW 13.1 13.0 12.8 13.2 13.4  14.0 R  Platelets 204 206 199 219 198  172 R  Neutrophils 67 68 68 69 69    Lymphs 21 22 21 18 19     Monocytes 8 8 8 8 8     Eos 2 1 2 3 2     Basos 1 1 1 1 1     Neutrophils Absolute 5.5 4.8 5.2 6.5 5.7    Lymphocytes Absolute 1.7  1.6 1.6 1.7 1.5    Monocytes Absolute 0.6 0.6 0.6 0.7 0.6    EOS (ABSOLUTE) 0.1 0.1 0.1 0.3 0.1    Basophils Absolute 0.1 0.1 0.1 0.1 0.0    Immature Granulocytes 1 0 0 1 1    Immature Grans (Abs) 0.1 0.0 0.0 0.1 0.1    Resulting Agency [facility] CON[facility]         Narrative Performed by: HOYT Croon All Conversations on this Encounter       Next appt: 02/29/2024 at 08:15 AM in [facility] ([facility])            Component Ref Range & Units (hover) 2 d ago  Hgb A1c MFr Bld 7.5 High   Comment:          Prediabetes: 5.7 - 6.4          Diabetes: >6.4    ____________________________________________  EKG   ____________________________________________    ____________________________________________   INITIAL IMPRESSION / ASSESSMENT AND PLAN   As part of my medical decision making, I reviewed the following data within the electronic MEDICAL RECORD NUMBER     No acute findings on physical exam.  Labs reveal hemoglobin A1c of 7.5.    Patient has minimal to starting metformin  at 1000 mg twice daily and follow-up in 3 months for hemoglobin A1c.    ____________________________________________   FINAL CLINICAL IMPRESSION  Onset diabetes otherwise well exam   ED Discharge Orders          Ordered    metFORMIN  (GLUCOPHAGE ) 1000 MG tablet  2 times daily with meals        11/29/23 1526             Note:  This document was prepared using Dragon voice recognition software and may include unintentional dictation errors.

## 2023-12-03 ENCOUNTER — Ambulatory Visit: Payer: Self-pay

## 2023-12-03 DIAGNOSIS — Z0000 Encounter for general adult medical examination without abnormal findings: Secondary | ICD-10-CM

## 2024-01-28 ENCOUNTER — Encounter: Payer: Self-pay | Admitting: Physician Assistant

## 2024-01-28 ENCOUNTER — Ambulatory Visit: Payer: Self-pay | Admitting: Physician Assistant

## 2024-01-28 ENCOUNTER — Other Ambulatory Visit: Payer: Self-pay

## 2024-01-28 VITALS — BP 125/86 | HR 91 | Temp 98.6°F | Resp 16 | Wt 238.0 lb

## 2024-01-28 DIAGNOSIS — I10 Essential (primary) hypertension: Secondary | ICD-10-CM

## 2024-01-28 DIAGNOSIS — R197 Diarrhea, unspecified: Secondary | ICD-10-CM

## 2024-01-28 MED ORDER — METFORMIN HCL ER 750 MG PO TB24
750.0000 mg | ORAL_TABLET | Freq: Every day | ORAL | 0 refills | Status: DC
Start: 2024-01-28 — End: 2024-02-29

## 2024-01-28 MED ORDER — DIPHENOXYLATE-ATROPINE 2.5-0.025 MG PO TABS: 1.0000 | ORAL_TABLET | Freq: Four times a day (QID) | ORAL | 0 refills | Status: DC | PRN

## 2024-01-28 MED ORDER — METOLAZONE 2.5 MG PO TABS: 2.5000 mg | ORAL_TABLET | Freq: Every day | ORAL | 3 refills | Status: AC

## 2024-01-28 MED ORDER — PRAZOSIN HCL 1 MG PO CAPS: 1.0000 mg | ORAL_CAPSULE | Freq: Every day | ORAL | 3 refills | Status: AC

## 2024-01-28 NOTE — Progress Notes (Signed)
 Stated he cannot tolerate Metformin and has loose bowels after taking it and several times per day saying "I can't keep a meal on my stomach" and that he couldn't make it to the bathroom several times in time.

## 2024-01-28 NOTE — Progress Notes (Signed)
   Subjective: Diarrhea    Patient ID: Jimmy Mccall, male    DOB: Apr 01, 1966, 58 y.o.   MRN: [id number]  HPI Patient presents with complaint of diarrhea for 2 months status post starting metformin 1000 mg twice daily.  Patient states has loose stools status post 1-2 of eating.  Denies blood in stool.   Review of Systems Diabetes and hypertension    Objective:   Physical Exam BP 125/86  Pulse Rate 91  Temp 98.6 F (37 C)  Weight 238 lb (108 kg)  Resp 16  SpO2 97 %   BMI: 34.15 kg/m2  BSA: 2.31 m2  Physical exam deferred       Assessment & Plan:  Patient given prescription for Lomotil.  Patient given prescription for metformin XR 750 mg.  Patient will follow-up in 1 month or sooner if condition worsens.

## 2024-02-29 ENCOUNTER — Other Ambulatory Visit: Payer: Self-pay | Admitting: Physician Assistant

## 2024-02-29 ENCOUNTER — Other Ambulatory Visit: Payer: Self-pay

## 2024-02-29 ENCOUNTER — Other Ambulatory Visit: Payer: 59

## 2024-02-29 DIAGNOSIS — E0800 Diabetes mellitus due to underlying condition with hyperosmolarity without nonketotic hyperglycemic-hyperosmolar coma (NKHHC): Secondary | ICD-10-CM

## 2024-02-29 LAB — POCT GLYCOSYLATED HEMOGLOBIN (HGB A1C): HbA1c POC (<> result, manual entry): 6 % (ref 4.0–5.6)

## 2024-02-29 MED ORDER — METFORMIN HCL ER 750 MG PO TB24: 750.0000 mg | ORAL_TABLET | Freq: Every day | ORAL | 3 refills | Status: DC

## 2024-02-29 NOTE — Progress Notes (Signed)
 Pt presents today to complete A1c POCT.  Results: 6.0

## 2024-03-04 ENCOUNTER — Other Ambulatory Visit: Payer: Self-pay

## 2024-03-04 NOTE — Progress Notes (Signed)
 Random DOT UDS completed after consent per [facility] policy and sent to [facility] per protocol.

## 2024-10-27 ENCOUNTER — Other Ambulatory Visit: Payer: Self-pay

## 2024-10-27 DIAGNOSIS — M109 Gout, unspecified: Secondary | ICD-10-CM

## 2024-10-27 MED ORDER — ALLOPURINOL 300 MG PO TABS: 300.0000 mg | ORAL_TABLET | Freq: Every day | ORAL | 3 refills | Status: AC

## 2024-11-21 ENCOUNTER — Ambulatory Visit: Payer: Self-pay

## 2024-11-21 DIAGNOSIS — Z0000 Encounter for general adult medical examination without abnormal findings: Secondary | ICD-10-CM

## 2024-11-21 LAB — POCT URINALYSIS DIPSTICK
Blood, UA: NEGATIVE
Glucose, UA: NEGATIVE
Ketones, UA: NEGATIVE
Leukocytes, UA: NEGATIVE
Nitrite, UA: NEGATIVE
Protein, UA: POSITIVE — AB
Spec Grav, UA: 1.015
Urobilinogen, UA: 0.2 U/dL
pH, UA: 6.5

## 2024-11-22 LAB — CMP12+LP+TP+TSH+6AC+PSA+CBC…
ALT: 35 IU/L (ref 0–44)
AST: 25 IU/L (ref 0–40)
Albumin: 4.4 g/dL (ref 3.8–4.9)
Alkaline Phosphatase: 116 IU/L (ref 47–123)
BUN/Creatinine Ratio: 14 (ref 9–20)
BUN: 16 mg/dL (ref 6–24)
Basophils Absolute: 0.1 x10E3/uL (ref 0.0–0.2)
Basos: 1 %
Bilirubin Total: 1.5 mg/dL — ABNORMAL HIGH (ref 0.0–1.2)
Calcium: 9.5 mg/dL (ref 8.7–10.2)
Chloride: 95 mmol/L — ABNORMAL LOW (ref 96–106)
Chol/HDL Ratio: 4.6 ratio (ref 0.0–5.0)
Cholesterol, Total: 178 mg/dL (ref 100–199)
Creatinine, Ser: 1.14 mg/dL (ref 0.76–1.27)
EOS (ABSOLUTE): 0.2 x10E3/uL (ref 0.0–0.4)
Eos: 2 %
Estimated CHD Risk: 0.9 times avg. (ref 0.0–1.0)
Free Thyroxine Index: 2.5 (ref 1.2–4.9)
GGT: 34 IU/L (ref 0–65)
Globulin, Total: 2.5 g/dL (ref 1.5–4.5)
Glucose: 116 mg/dL — ABNORMAL HIGH (ref 70–99)
HDL: 39 mg/dL — ABNORMAL LOW
Hematocrit: 50.5 % (ref 37.5–51.0)
Hemoglobin: 17 g/dL (ref 13.0–17.7)
Immature Grans (Abs): 0 x10E3/uL (ref 0.0–0.1)
Immature Granulocytes: 0 %
Iron: 165 ug/dL (ref 38–169)
LDH: 151 IU/L (ref 121–224)
LDL Chol Calc (NIH): 113 mg/dL — ABNORMAL HIGH (ref 0–99)
Lymphocytes Absolute: 1.9 x10E3/uL (ref 0.7–3.1)
Lymphs: 21 %
MCH: 31.3 pg (ref 26.6–33.0)
MCHC: 33.7 g/dL (ref 31.5–35.7)
MCV: 93 fL (ref 79–97)
Monocytes Absolute: 0.6 x10E3/uL (ref 0.1–0.9)
Monocytes: 7 %
Neutrophils Absolute: 6.1 x10E3/uL (ref 1.4–7.0)
Neutrophils: 68 %
Phosphorus: 3.5 mg/dL (ref 2.8–4.1)
Platelets: 223 x10E3/uL (ref 150–450)
Potassium: 3.1 mmol/L — ABNORMAL LOW (ref 3.5–5.2)
Prostate Specific Ag, Serum: 0.8 ng/mL (ref 0.0–4.0)
RBC: 5.43 x10E6/uL (ref 4.14–5.80)
RDW: 13.1 % (ref 11.6–15.4)
Sodium: 142 mmol/L (ref 134–144)
T3 Uptake Ratio: 28 % (ref 24–39)
T4, Total: 8.9 ug/dL (ref 4.5–12.0)
TSH: 2.33 u[IU]/mL (ref 0.450–4.500)
Total Protein: 6.9 g/dL (ref 6.0–8.5)
Triglycerides: 147 mg/dL (ref 0–149)
Uric Acid: 7.3 mg/dL (ref 3.8–8.4)
VLDL Cholesterol Cal: 26 mg/dL (ref 5–40)
WBC: 8.8 x10E3/uL (ref 3.4–10.8)
eGFR: 75 mL/min/1.73

## 2024-11-22 LAB — HGB A1C W/O EAG: Hgb A1c MFr Bld: 6.9 % — ABNORMAL HIGH (ref 4.8–5.6)

## 2024-11-27 ENCOUNTER — Other Ambulatory Visit: Payer: Self-pay

## 2024-11-27 ENCOUNTER — Ambulatory Visit: Payer: Self-pay | Admitting: Physician Assistant

## 2024-11-27 ENCOUNTER — Encounter: Payer: Self-pay | Admitting: Physician Assistant

## 2024-11-27 VITALS — BP 125/81 | HR 76 | Temp 97.7°F | Resp 16 | Ht 70.0 in | Wt 230.0 lb

## 2024-11-27 DIAGNOSIS — Z0000 Encounter for general adult medical examination without abnormal findings: Secondary | ICD-10-CM

## 2024-11-27 DIAGNOSIS — E0800 Diabetes mellitus due to underlying condition with hyperosmolarity without nonketotic hyperglycemic-hyperosmolar coma (NKHHC): Secondary | ICD-10-CM

## 2024-11-27 MED ORDER — METFORMIN HCL ER 750 MG PO TB24: 750.0000 mg | ORAL_TABLET | Freq: Every day | ORAL | 3 refills | Status: AC

## 2024-11-27 MED ORDER — MELOXICAM 15 MG PO TABS: 15.0000 mg | ORAL_TABLET | Freq: Every day | ORAL | 1 refills | Status: AC

## 2024-11-27 NOTE — Progress Notes (Signed)
 Pt presents today to complete physical, pt states he takes one aleve for his left knee pain but wants to make sure that's ok to do. Pt hurt his knee at work back in 2017. MRI was done 04/13/16

## 2024-11-27 NOTE — Progress Notes (Signed)
 "  [facility]  ____________________________________________   None    (approximate)  I have reviewed the triage vital signs and the nursing notes.   HISTORY  Chief Complaint No chief complaint on file.   HPI Jimmy Mccall is a 59 y.o. male patient presents for annual physical exam.Patient was concern for intermitting left knee pain.  Patient states pain has increased in the last 2 years.  Patient had a knee injury in 2017 resulting in physical therapy and was told he has mild arthritis.  States complaint is relief by taking over-the-counter NSAIDs.         Past Medical History:  Diagnosis Date   Diabetes (HCC) 11/29/2023   Elevated LDL cholesterol level    Gout    Hypertension    Kidney stone    Low HDL (under 40)    Lumbar strain    Obesity    Seasonal rhinitis    Shoulder pain, right     Patient Active Problem List   Diagnosis Date Noted   Diabetes (HCC) 11/29/2023   Hypertension, uncontrolled 12/01/2020   Routine adult health maintenance 12/01/2020   Idiopathic gout 12/01/2020    Past Surgical History:  Procedure Laterality Date   CHOLECYSTECTOMY     COLONOSCOPY WITH PROPOFOL  N/A 08/17/2023   Procedure: COLONOSCOPY WITH PROPOFOL ;  Surgeon: Onita Elspeth Sharper, DO;  Location: [facility] ENDOSCOPY;  Service: Gastroenterology;  Laterality: N/A;   POLYPECTOMY  08/17/2023   Procedure: POLYPECTOMY;  Surgeon: Onita Elspeth Sharper, DO;  Location: [facility] ENDOSCOPY;  Service: Gastroenterology;;   WISDOM TOOTH EXTRACTION      Prior to Admission medications  Medication Sig Start Date End Date Taking? Authorizing Provider  allopurinol  (ZYLOPRIM ) 300 MG tablet Take 1 tablet (300 mg total) by mouth daily. 10/27/24   Claudene Tanda POUR, PA-C  diphenoxylate -atropine  (LOMOTIL ) 2.5-0.025 MG tablet Take 1 tablet by mouth 4 (four) times daily as needed for diarrhea or loose stools. 01/28/24   Claudene Tanda POUR, PA-C  fexofenadine (ALLEGRA) 60 MG  tablet Take by mouth. Patient not taking: Reported on 01/28/2024    [provider]  metFORMIN  (GLUCOPHAGE -XR) 750 MG 24 hr tablet Take 1 tablet (750 mg total) by mouth daily with breakfast. 02/29/24   Claudene Tanda POUR, PA-C  metolazone  (ZAROXOLYN ) 2.5 MG tablet Take 1 tablet (2.5 mg total) by mouth daily. 01/28/24   Claudene Tanda POUR, PA-C  prazosin  (MINIPRESS ) 1 MG capsule Take 1 capsule (1 mg total) by mouth at bedtime. 01/28/24   Claudene Tanda POUR, PA-C    Allergies Patient has no known allergies.  No family history on file.  Social History Social History[1]  Review of Systems Constitutional: No fever/chills Eyes: No visual changes. ENT: No sore throat. Cardiovascular: Denies chest pain. Respiratory: Denies shortness of breath. Gastrointestinal: No abdominal pain.  No nausea, no vomiting.  No diarrhea.  No constipation. Genitourinary: Negative for dysuria. Musculoskeletal: Negative for back pain.  Left knee pain Skin: Negative for rash. Neurological: Negative for headaches, focal weakness or numbness.  Endocrine: Diabetes and hypertension  ____________________________________________   PHYSICAL EXAM:  VITAL SIGNS:  11/27/2024   8:19 AM  BP 125/81  Cuff Size Large  Pulse Rate 76  Temp 97.7 F (36.5 C)  Temp Source Temporal  Weight 230 lb (104.3 kg)  Height 5' 10 (1.778 m)  Resp 16  SpO2 95 %   BMI: 33.00 kg/m2  BSA: 2.27 m2   Constitutional: Alert and oriented. Well appearing and in  no acute distress. Eyes: Conjunctivae are normal. PERRL. EOMI. Head: Atraumatic. Nose: No congestion/rhinnorhea. Mouth/Throat: Mucous membranes are moist.  Oropharynx non-erythematous. Neck: No stridor.  No cervical spine tenderness to palpation. Hematological/Lymphatic/Immunilogical: No cervical lymphadenopathy. Cardiovascular: Normal rate, regular rhythm. Grossly normal heart sounds.  Good peripheral circulation. Respiratory: Normal respiratory effort.  No retractions. Lungs  CTAB. Gastrointestinal: Soft and nontender. No distention. No abdominal bruits. No CVA tenderness. Genitourinary: Deferred Musculoskeletal: No lower extremity tenderness nor edema.  No joint effusions. Neurologic:  Normal speech and language. No gross focal neurologic deficits are appreciated. No gait instability. Skin:  Skin is warm, dry and intact. No rash noted. Psychiatric: Mood and affect are normal. Speech and behavior are normal.  ____________________________________________   LABS           Component Ref Range & Units (hover) 6 d ago (11/21/24) 1 yr ago (11/27/23) 2 yr ago (11/23/22) 3 yr ago (11/24/21) 4 yr ago (11/24/20) 4 yr ago (12/15/19) 4 yr ago (12/02/19)  Color, UA yellow yellow yellow dark yelllow Dark Yellow Dark Yellow Yellow  Clarity, UA clear clear clear clear Clear Clear Clear  Glucose, UA Negative Negative Negative Negative Negative Negative Negative  Bilirubin, UA 1+ neg neg negative Negative Negative Negative  Comment: plus  Ketones, UA neg neg neg negative Negative Negative Negative  Spec Grav, UA 1.015 1.015 1.025 1.020 1.025 1.025 1.025  Blood, UA neg neg neg negative Negative Negative Negative  pH, UA 6.5 6.0 6.0 6.0 6.0 6.0 6.0  Protein, UA Positive Abnormal  Negative Negative Positive Abnormal  CM Negative Negative Negative  Comment: -+  Urobilinogen, UA 0.2 0.2 1.0 0.2 0.2 0.2 0.2  Nitrite, UA neg neg neg negative Negative Negative Negative  Leukocytes, UA Negative Negative Negative Negative Negative Negative Negative  Appearance    dark     Odor                 Component Ref Range & Units (hover) 6 d ago 9 mo ago 1 yr ago  Hgb A1c MFr Bld 6.9 High  R 7.5 High  CM  Comment:          Prediabetes: 5.7 - 6.4          Diabetes: >6.4          Glycemic control for adults with diabetes: <7.0  HbA1c POC (<> result, manual entry)  6.0 R               Ref Range & Units (hover) 6 d ago (11/21/24) 1 yr ago (11/27/23) 2 yr ago (11/23/22) 3 yr ago (11/24/21) 4  yr ago (11/24/20) 4 yr ago (12/15/19) 9 yr ago (12/24/14) 9 yr ago (12/24/14)  Glucose 116 High  121 High  110 High  105 High  93 R 104 High  R 94 R   Uric Acid 7.3 6.4 CM 6.4 CM 6.0 CM 4.9 CM 4.5 CM    Comment:            Therapeutic target for gout patients: <6.0  BUN 16 14 16 18 14 15 14  R   Creatinine, Ser 1.14 1.14 1.26 1.22 1.13 1.16 1.15 R   eGFR 75 75 67 70      BUN/Creatinine Ratio 14 12 13 15 12 13     Sodium 142 142 142 141 139 141 141 R   Potassium 3.1 Low  3.5 2.8 Low  3.5 3.9 3.9 3.8 R   Chloride 95 Low  97 97  98 98 101 105 R   Calcium 9.5 9.3 9.2 9.9 9.6 9.1 8.9 R   Phosphorus 3.5 3.7 3.3 3.7 3.5 3.2    Total Protein 6.9 6.8 6.9 6.5 7.1 6.8    Albumin 4.4 4.4 4.6 4.5 4.7 4.4    Globulin, Total 2.5 2.4 2.3 2.0 2.4 2.4    Bilirubin Total 1.5 High  1.3 High  1.7 High  1.4 High  1.4 High  1.5 High     Alkaline Phosphatase 116 114 R 109 R 115 R 135 High  R, CM 124 High  R    LDH 151 165 146 146 167 197    AST 25 24 23 22 25 20     ALT 35 31 32 27 40 34    GGT 34 36 33 33 49 37    Iron 165 95 147 98 99 135    Cholesterol, Total 178 178 163 168 199 177    Triglycerides 147 145 114 122 126 111    HDL 39 Low  36 Low  39 Low  36 Low  39 Low  40    VLDL Cholesterol Cal 26 26 21 22 23 20     LDL Chol Calc (NIH) 113 High  116 High  103 High  110 High  137 High  117 High     Chol/HDL Ratio 4.6 4.9 CM 4.2 CM 4.7 CM 5.1 High  CM 4.4 CM    Comment:                                   T. Chol/HDL Ratio                                             Men  Women                               1/2 Avg.Risk  3.4    3.3                                   Avg.Risk  5.0    4.4                                2X Avg.Risk  9.6    7.1                                3X Avg.Risk 23.4   11.0  Estimated CHD Risk 0.9 1.0 CM 0.8 CM 0.9 CM 1.1 High  CM 0.9 CM    Comment: The CHD Risk is based on the T. Chol/HDL ratio. Other factors affect CHD Risk such as hypertension, smoking, diabetes, severe obesity, and  family history of premature CHD.  TSH 2.330 2.820 3.040 2.690 2.750 2.980    T4, Total 8.9 8.8 7.9 7.8 7.7 6.7    T3 Uptake Ratio 28 26 26 26 24 22  Low     Free Thyroxine Index 2.5 2.3 2.1 2.0 1.8 1.5    Prostate Specific Ag, Serum 0.8 1.1 CM 0.8 CM 0.7 CM 0.7  CM 0.9 CM    Comment: Roche ECLIA methodology. According to the American U[employer] Association, Serum PSA should decrease and remain at undetectable levels after radical prostatectomy. The AUA defines biochemical recurrence as an initial PSA value 0.2 ng/mL or greater followed by a subsequent confirmatory PSA value 0.2 ng/mL or greater. Values obtained with different assay methods or kits cannot be used interchangeably. Results cannot be interpreted as absolute evidence of the presence or absence of malignant disease.  WBC 8.8 8.1 7.2 7.6 9.3 8.1  7.0 R  RBC 5.43 5.16 5.30 5.04 5.57 5.53  5.14 R  Hemoglobin 17.0 15.9 15.7 15.7 17.1 17.2    Hematocrit 50.5 47.5 47.4 45.5 50.4 50.1    MCV 93 92 89 90 91 91  90 R  MCH 31.3 30.8 29.6 31.2 30.7 31.1  30.4 R  MCHC 33.7 33.5 33.1 34.5 33.9 34.3  33.8 R  RDW 13.1 13.1 13.0 12.8 13.2 13.4  14.0 R  Platelets 223 204 206 199 219 198  172 R  Neutrophils 68 67 68 68 69 69    Lymphs 21 21 22 21 18 19     Monocytes 7 8 8 8 8 8     Eos 2 2 1 2 3 2     Basos 1 1 1 1 1 1     Neutrophils Absolute 6.1 5.5 4.8 5.2 6.5 5.7    Lymphocytes Absolute 1.9 1.7 1.6 1.6 1.7 1.5    Monocytes Absolute 0.6 0.6 0.6 0.6 0.7 0.6    EOS (ABSOLUTE) 0.2 0.1 0.1 0.1 0.3 0.1    Basophils Absolute 0.1 0.1 0.1 0.1 0.1 0.0    Immature Granulocytes 0 1 0 0 1 1    Immature Grans (Abs) 0.0 0.1 0.0 0.0 0.1 0.1                  ____________________________________________  EKG  Normal sinus rhythm at 64 bpm ____________________________________________   ____________________________________________   INITIAL IMPRESSION / ASSESSMENT AND PLAN  As part of my medical decision making, I reviewed the following data  within the electronic MEDICAL RECORD NUMBER      No acute findings on physical exam, EKG, or labs.        ____________________________________________   FINAL CLINICAL IMPRESSION Well exam.  Mild arthritis left knee.  Patient given a prescription for meloxicam  15 mg.   ED Discharge Orders     None        Note:  This document was prepared using Dragon voice recognition software and may include unintentional dictation errors.     [1]  Social History Tobacco Use   Smoking status: Never   Smokeless tobacco: Never  Vaping Use   Vaping status: Never Used  Substance Use Topics   Alcohol use: Never   Drug use: Never   "

## 2024-12-12 ENCOUNTER — Other Ambulatory Visit: Payer: Self-pay

## 2024-12-12 DIAGNOSIS — Z0283 Encounter for blood-alcohol and blood-drug test: Secondary | ICD-10-CM

## 2024-12-12 NOTE — Progress Notes (Signed)
 Presents to [facility] for random DOT drug screen  [facility] Acct #:  [id number] [facility] Specimen #:  [id number]  8:40 am - Shy bladder protocol initiated.  60 oz of water given after 1st unsuccessful attempt to urinate in specimen cup.  States I only urinate about 3 times a day.  I just went right before they told me I had to come over here for a drug test.  9:50 am - 2nd attempt to urinate in specimen cup successful.  Provided enough urine for two 30 ml bottles.  Specimen will be picked up by [facility] at regularly scheduled pick-up time (4:00 pm).
